# Patient Record
Sex: Female | Born: 1960 | Race: White | Hispanic: Yes | Smoking: Never smoker
Health system: Southern US, Community
[De-identification: ages and names within clinical notes are randomized; demographics above are authoritative.]

## PROBLEM LIST (undated history)

## (undated) DIAGNOSIS — T7840XA Allergy, unspecified, initial encounter: Secondary | ICD-10-CM

## (undated) DIAGNOSIS — D649 Anemia, unspecified: Secondary | ICD-10-CM

## (undated) DIAGNOSIS — M1990 Unspecified osteoarthritis, unspecified site: Secondary | ICD-10-CM

## (undated) HISTORY — DX: Anemia, unspecified: D64.9

## (undated) HISTORY — DX: Allergy, unspecified, initial encounter: T78.40XA

## (undated) HISTORY — DX: Unspecified osteoarthritis, unspecified site: M19.90

---

## 2002-08-26 HISTORY — PX: TUBAL LIGATION: SHX77

## 2002-10-25 ENCOUNTER — Encounter: Admission: RE | Admit: 2002-10-25 | Discharge: 2002-10-25 | Payer: Self-pay | Admitting: Sports Medicine

## 2004-04-09 ENCOUNTER — Encounter: Admission: RE | Admit: 2004-04-09 | Discharge: 2004-04-09 | Payer: Self-pay | Admitting: Sports Medicine

## 2004-04-09 ENCOUNTER — Encounter: Payer: Self-pay | Admitting: Family Medicine

## 2004-06-18 ENCOUNTER — Ambulatory Visit: Payer: Self-pay | Admitting: Family Medicine

## 2004-06-29 ENCOUNTER — Ambulatory Visit (HOSPITAL_COMMUNITY): Admission: RE | Admit: 2004-06-29 | Discharge: 2004-06-29 | Payer: Self-pay | Admitting: Family Medicine

## 2005-07-26 ENCOUNTER — Encounter (INDEPENDENT_AMBULATORY_CARE_PROVIDER_SITE_OTHER): Payer: Self-pay | Admitting: *Deleted

## 2005-07-26 LAB — CONVERTED CEMR LAB

## 2005-08-05 ENCOUNTER — Encounter: Payer: Self-pay | Admitting: Family Medicine

## 2005-08-05 ENCOUNTER — Ambulatory Visit: Payer: Self-pay | Admitting: Family Medicine

## 2005-08-30 ENCOUNTER — Ambulatory Visit (HOSPITAL_COMMUNITY): Admission: RE | Admit: 2005-08-30 | Discharge: 2005-08-30 | Payer: Self-pay | Admitting: Family Medicine

## 2006-10-24 ENCOUNTER — Encounter (INDEPENDENT_AMBULATORY_CARE_PROVIDER_SITE_OTHER): Payer: Self-pay | Admitting: *Deleted

## 2006-12-14 ENCOUNTER — Encounter: Payer: Self-pay | Admitting: Family Medicine

## 2006-12-15 ENCOUNTER — Encounter (INDEPENDENT_AMBULATORY_CARE_PROVIDER_SITE_OTHER): Payer: Self-pay | Admitting: Family Medicine

## 2006-12-15 ENCOUNTER — Ambulatory Visit: Payer: Self-pay | Admitting: Family Medicine

## 2006-12-15 ENCOUNTER — Encounter: Payer: Self-pay | Admitting: Family Medicine

## 2006-12-15 LAB — CONVERTED CEMR LAB
Chlamydia, DNA Probe: NEGATIVE
Free T4: 1.02 ng/dL (ref 0.89–1.80)
GC Probe Amp, Genital: NEGATIVE
HCT: 36.5 % (ref 36.0–46.0)
Hemoglobin: 12 g/dL (ref 12.0–15.0)
KOH Prep: NEGATIVE
MCHC: 32.9 g/dL (ref 30.0–36.0)
MCV: 83 fL (ref 78.0–100.0)
Platelets: 304 10*3/uL (ref 150–400)
RBC: 4.4 M/uL (ref 3.87–5.11)
RDW: 14.4 % — ABNORMAL HIGH (ref 11.5–14.0)
TSH: 0.893 microintl units/mL (ref 0.350–5.50)
WBC: 4.9 10*3/uL (ref 4.0–10.5)

## 2006-12-17 ENCOUNTER — Encounter (INDEPENDENT_AMBULATORY_CARE_PROVIDER_SITE_OTHER): Payer: Self-pay | Admitting: Family Medicine

## 2006-12-17 LAB — CONVERTED CEMR LAB: Pap Smear: NORMAL

## 2006-12-22 ENCOUNTER — Encounter (INDEPENDENT_AMBULATORY_CARE_PROVIDER_SITE_OTHER): Payer: Self-pay | Admitting: Family Medicine

## 2006-12-23 ENCOUNTER — Encounter (INDEPENDENT_AMBULATORY_CARE_PROVIDER_SITE_OTHER): Payer: Self-pay | Admitting: Family Medicine

## 2006-12-29 ENCOUNTER — Encounter: Payer: Self-pay | Admitting: Family Medicine

## 2006-12-29 ENCOUNTER — Ambulatory Visit: Payer: Self-pay | Admitting: Family Medicine

## 2007-07-27 ENCOUNTER — Ambulatory Visit: Payer: Self-pay | Admitting: Family Medicine

## 2007-07-27 LAB — CONVERTED CEMR LAB
H Pylori IgG: POSITIVE
HCT: 39.8 % (ref 36.0–46.0)
Hemoglobin: 13 g/dL (ref 12.0–15.0)
MCHC: 32.7 g/dL (ref 30.0–36.0)
MCV: 89.8 fL (ref 78.0–100.0)
Platelets: 339 10*3/uL (ref 150–400)
RBC: 4.43 M/uL (ref 3.87–5.11)
RDW: 14.6 % (ref 11.5–15.5)
WBC: 5.4 10*3/uL (ref 4.0–10.5)

## 2007-07-28 ENCOUNTER — Encounter: Payer: Self-pay | Admitting: Family Medicine

## 2007-12-23 ENCOUNTER — Ambulatory Visit (HOSPITAL_COMMUNITY): Admission: RE | Admit: 2007-12-23 | Discharge: 2007-12-23 | Payer: Self-pay | Admitting: Family Medicine

## 2007-12-28 ENCOUNTER — Encounter: Payer: Self-pay | Admitting: Family Medicine

## 2008-01-29 ENCOUNTER — Encounter: Payer: Self-pay | Admitting: Family Medicine

## 2008-02-08 ENCOUNTER — Ambulatory Visit: Payer: Self-pay | Admitting: Family Medicine

## 2008-03-02 ENCOUNTER — Encounter: Payer: Self-pay | Admitting: Family Medicine

## 2008-03-02 ENCOUNTER — Ambulatory Visit: Payer: Self-pay | Admitting: Family Medicine

## 2008-04-29 IMAGING — MG MM DIGITAL SCREENING BILAT
4 series · 4 of 4 positions shown · non-contrast
Comparison: none

DG SCREEN MAMMOGRAM BILATERAL
Bilateral CC and MLO view(s) were taken.

DIGITAL SCREENING MAMMOGRAM WITH CAD:
There are scattered fibroglandular densities.  No masses or malignant type calcifications are 
identified.  Compared with prior studies.

[R CC]
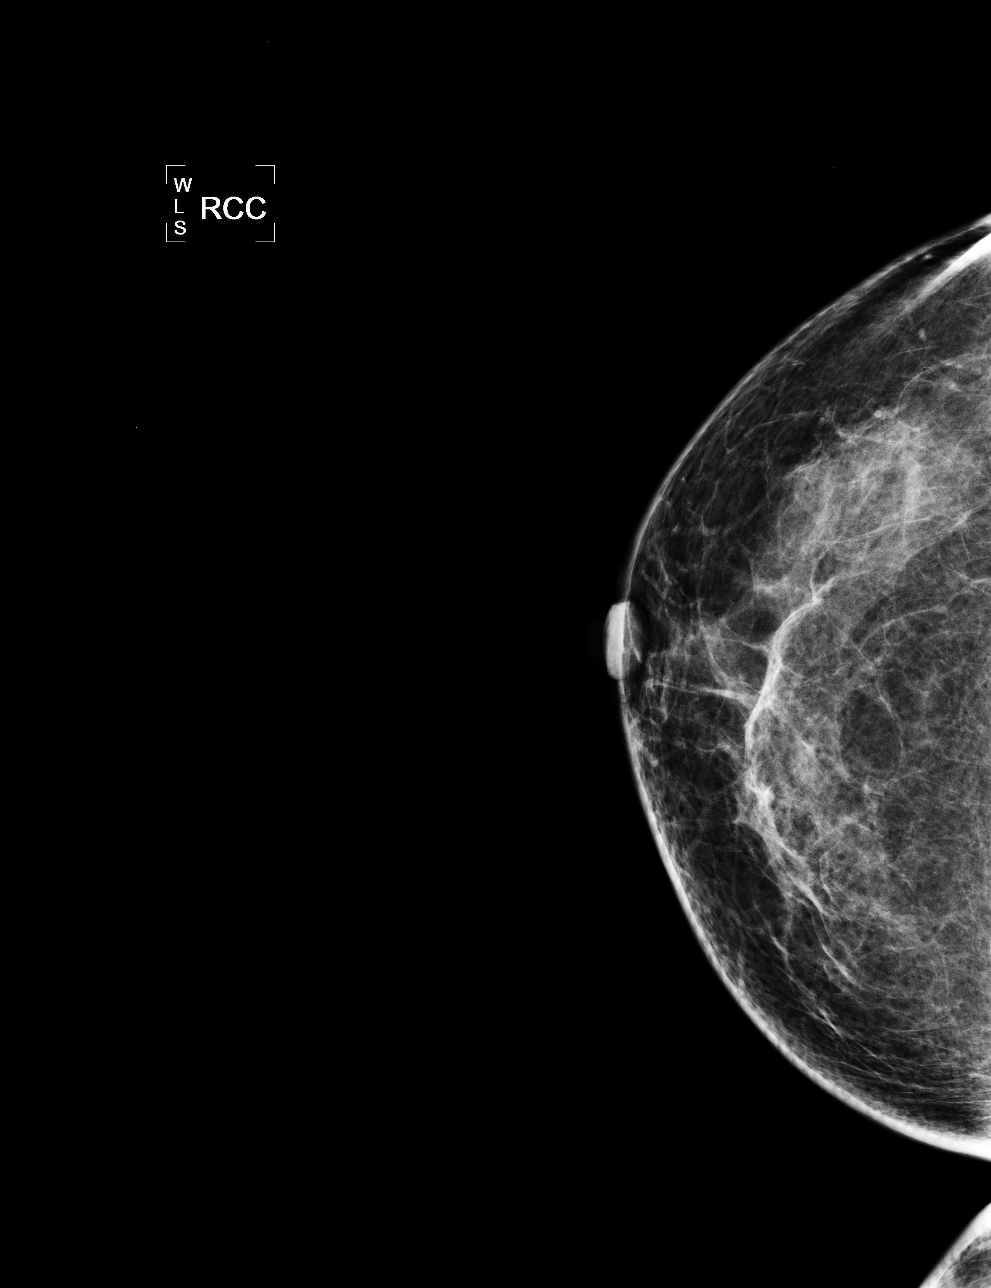

[R MLO]
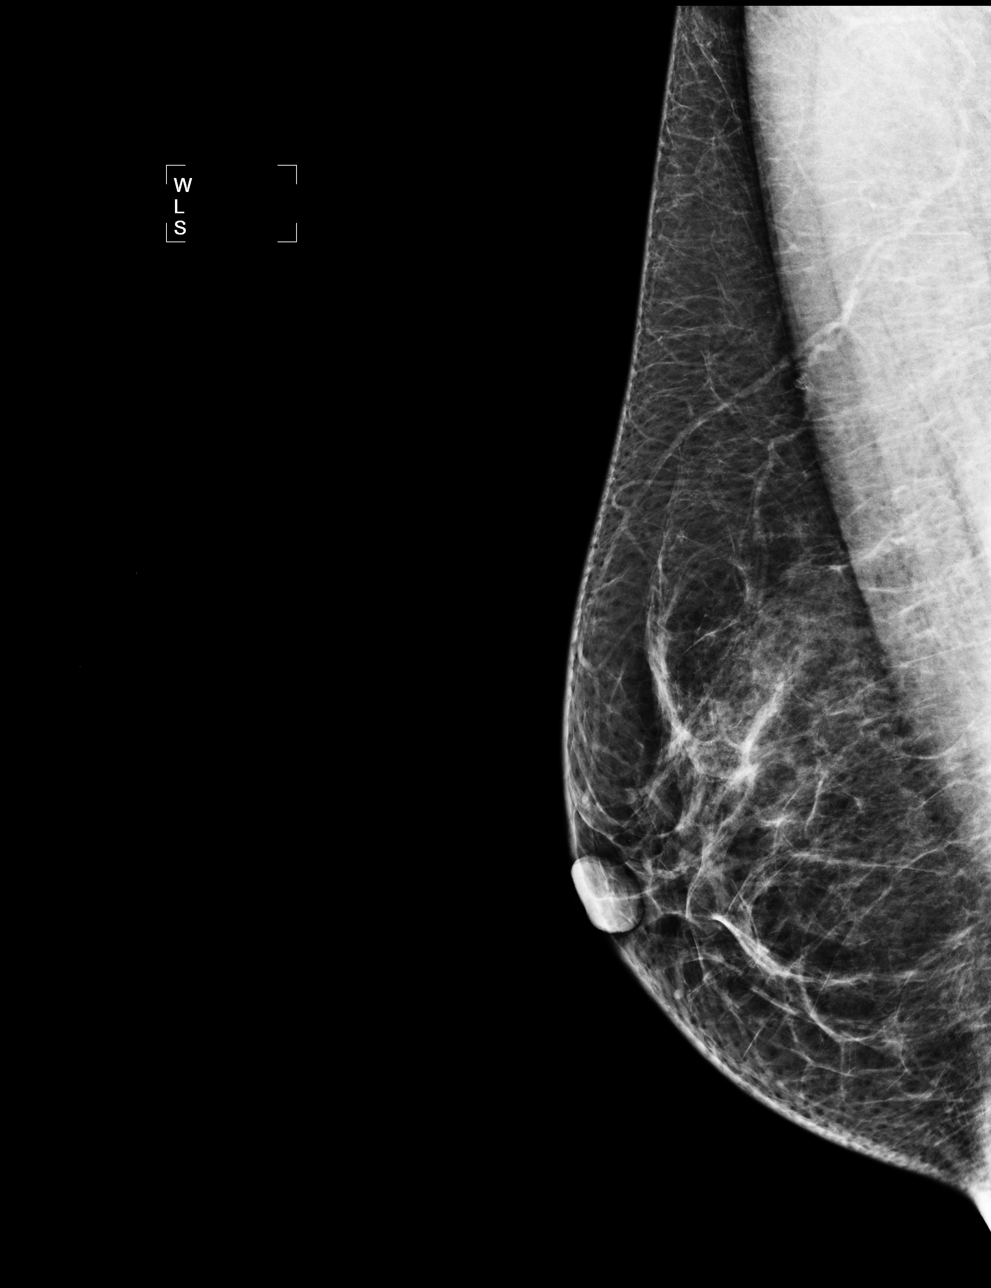

[L CC]
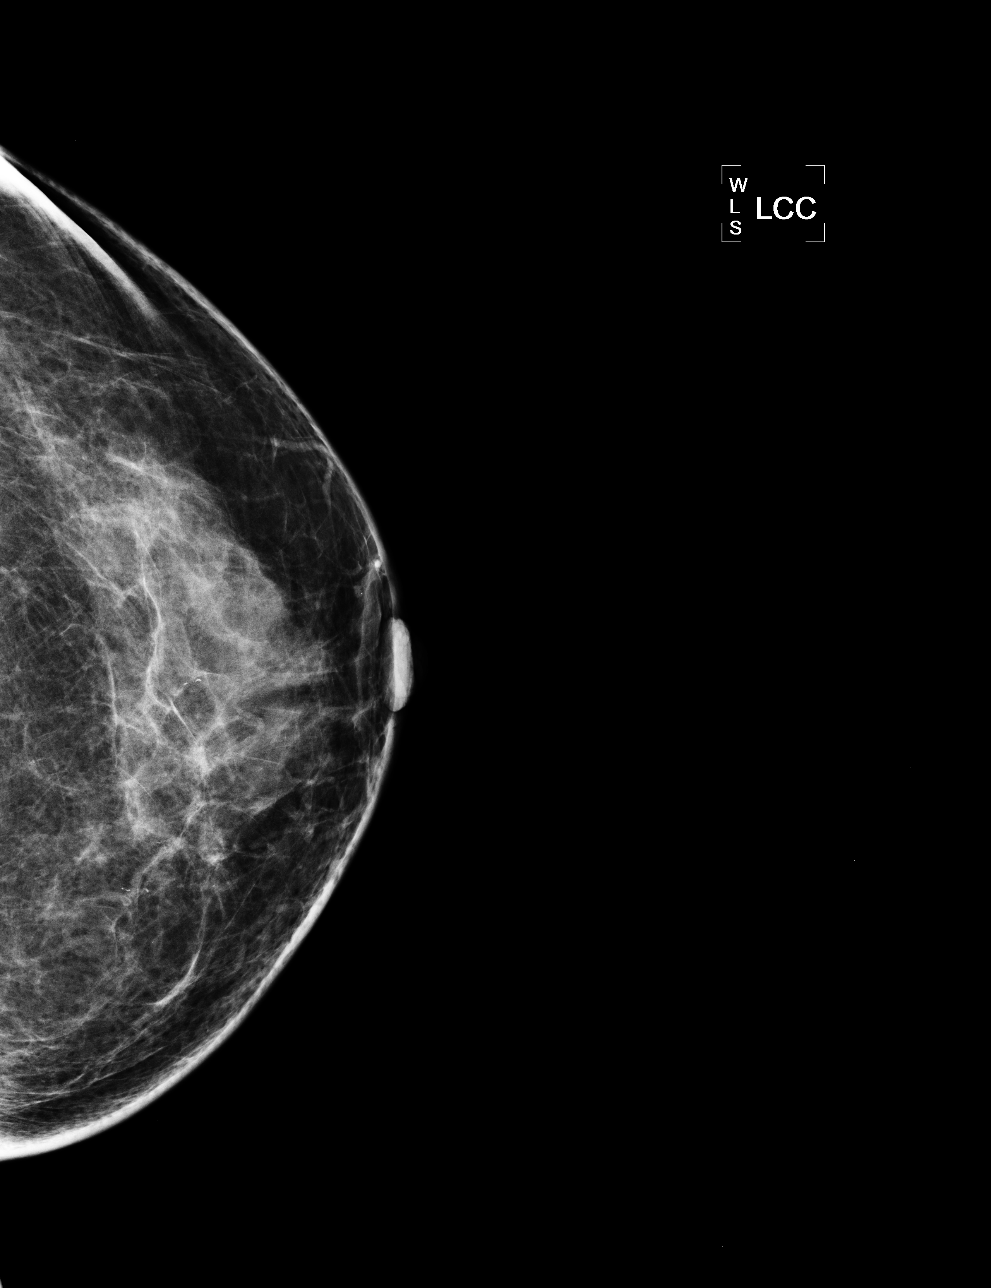

[L MLO]
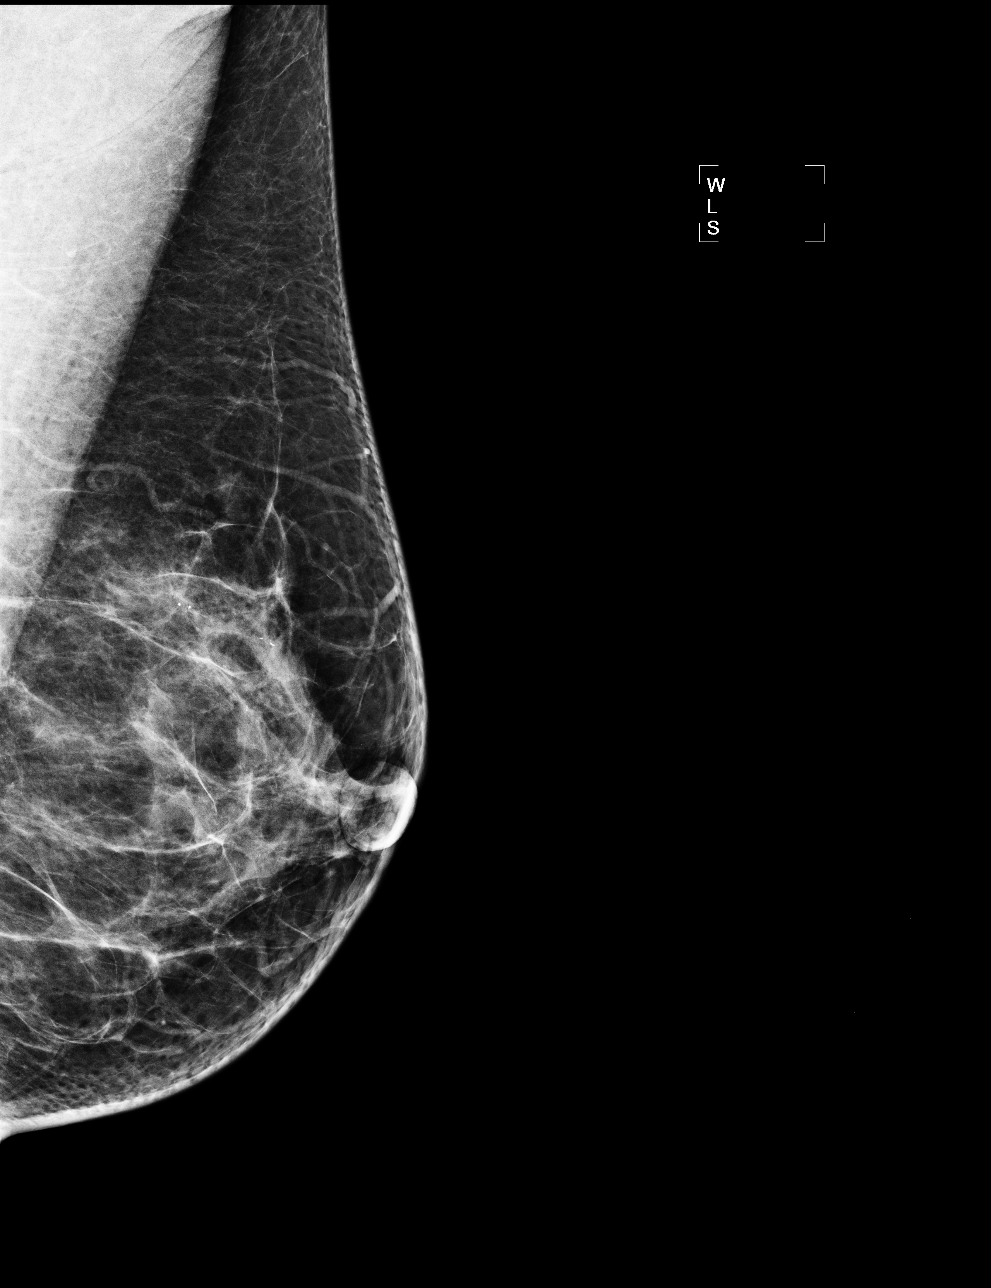

[4 of 4 positions shown; findings below may reference images not displayed]

IMPRESSION: No specific mammographic evidence of malignancy.  Next screening mammogram is recommended in one 
year.

ASSESSMENT: Negative - BI-RADS 1

Screening mammogram in 1 year.
ANALYZED BY COMPUTER AIDED DETECTION. , THIS PROCEDURE WAS A DIGITAL MAMMOGRAM.

## 2008-07-11 ENCOUNTER — Ambulatory Visit: Payer: Self-pay | Admitting: Family Medicine

## 2008-07-14 ENCOUNTER — Encounter: Payer: Self-pay | Admitting: Family Medicine

## 2008-07-20 ENCOUNTER — Ambulatory Visit (HOSPITAL_COMMUNITY): Admission: RE | Admit: 2008-07-20 | Discharge: 2008-07-20 | Payer: Self-pay | Admitting: Family Medicine

## 2008-08-08 ENCOUNTER — Encounter (INDEPENDENT_AMBULATORY_CARE_PROVIDER_SITE_OTHER): Payer: Self-pay | Admitting: Family Medicine

## 2008-08-08 ENCOUNTER — Ambulatory Visit: Payer: Self-pay | Admitting: Family Medicine

## 2008-08-08 ENCOUNTER — Encounter (INDEPENDENT_AMBULATORY_CARE_PROVIDER_SITE_OTHER): Payer: Self-pay | Admitting: *Deleted

## 2008-08-08 ENCOUNTER — Other Ambulatory Visit: Admission: RE | Admit: 2008-08-08 | Discharge: 2008-08-08 | Payer: Self-pay | Admitting: Family Medicine

## 2008-08-08 DIAGNOSIS — H11009 Unspecified pterygium of unspecified eye: Secondary | ICD-10-CM | POA: Insufficient documentation

## 2008-08-08 LAB — CONVERTED CEMR LAB
Chlamydia, DNA Probe: NEGATIVE
GC Probe Amp, Genital: NEGATIVE

## 2008-08-09 ENCOUNTER — Encounter (INDEPENDENT_AMBULATORY_CARE_PROVIDER_SITE_OTHER): Payer: Self-pay | Admitting: Family Medicine

## 2008-12-30 ENCOUNTER — Ambulatory Visit (HOSPITAL_COMMUNITY): Admission: RE | Admit: 2008-12-30 | Discharge: 2008-12-30 | Payer: Self-pay | Admitting: Obstetrics & Gynecology

## 2009-04-24 ENCOUNTER — Ambulatory Visit: Payer: Self-pay | Admitting: Family Medicine

## 2009-04-24 DIAGNOSIS — G4700 Insomnia, unspecified: Secondary | ICD-10-CM

## 2009-05-08 ENCOUNTER — Ambulatory Visit: Payer: Self-pay | Admitting: Family Medicine

## 2009-07-27 ENCOUNTER — Ambulatory Visit: Payer: Self-pay | Admitting: Family Medicine

## 2009-07-27 DIAGNOSIS — G5600 Carpal tunnel syndrome, unspecified upper limb: Secondary | ICD-10-CM | POA: Insufficient documentation

## 2009-07-27 DIAGNOSIS — E663 Overweight: Secondary | ICD-10-CM | POA: Insufficient documentation

## 2009-08-28 ENCOUNTER — Ambulatory Visit: Payer: Self-pay | Admitting: Family Medicine

## 2009-08-28 DIAGNOSIS — N959 Unspecified menopausal and perimenopausal disorder: Secondary | ICD-10-CM | POA: Insufficient documentation

## 2009-09-03 ENCOUNTER — Encounter: Payer: Self-pay | Admitting: Family Medicine

## 2009-09-03 LAB — CONVERTED CEMR LAB
ALT: 25 units/L (ref 0–35)
AST: 26 units/L (ref 0–37)
Albumin: 4.1 g/dL (ref 3.5–5.2)
Alkaline Phosphatase: 103 units/L (ref 39–117)
BUN: 16 mg/dL (ref 6–23)
CO2: 25 meq/L (ref 19–32)
Calcium: 9.2 mg/dL (ref 8.4–10.5)
Chloride: 102 meq/L (ref 96–112)
Creatinine, Ser: 0.69 mg/dL (ref 0.40–1.20)
Direct LDL: 122 mg/dL — ABNORMAL HIGH
Glucose, Bld: 97 mg/dL (ref 70–99)
Potassium: 4.1 meq/L (ref 3.5–5.3)
Sodium: 140 meq/L (ref 135–145)
TSH: 2.356 microintl units/mL (ref 0.350–4.500)
Total Bilirubin: 0.4 mg/dL (ref 0.3–1.2)
Total Protein: 6.9 g/dL (ref 6.0–8.3)

## 2010-01-01 ENCOUNTER — Encounter: Payer: Self-pay | Admitting: Family Medicine

## 2010-01-01 ENCOUNTER — Ambulatory Visit (HOSPITAL_COMMUNITY): Admission: RE | Admit: 2010-01-01 | Discharge: 2010-01-01 | Payer: Self-pay | Admitting: Obstetrics & Gynecology

## 2010-01-15 ENCOUNTER — Ambulatory Visit: Payer: Self-pay | Admitting: Family Medicine

## 2010-01-15 DIAGNOSIS — R519 Headache, unspecified: Secondary | ICD-10-CM | POA: Insufficient documentation

## 2010-01-15 DIAGNOSIS — R51 Headache: Secondary | ICD-10-CM

## 2010-01-29 ENCOUNTER — Ambulatory Visit: Payer: Self-pay | Admitting: Family Medicine

## 2010-06-04 ENCOUNTER — Ambulatory Visit: Payer: Self-pay | Admitting: Family Medicine

## 2010-06-04 DIAGNOSIS — M2550 Pain in unspecified joint: Secondary | ICD-10-CM | POA: Insufficient documentation

## 2010-06-06 ENCOUNTER — Ambulatory Visit: Payer: Self-pay | Admitting: Family Medicine

## 2010-06-06 ENCOUNTER — Encounter: Payer: Self-pay | Admitting: Family Medicine

## 2010-06-06 LAB — CONVERTED CEMR LAB
HCT: 39.1 % (ref 36.0–46.0)
Hemoglobin: 13.3 g/dL (ref 12.0–15.0)
MCHC: 34 g/dL (ref 30.0–36.0)
MCV: 86.9 fL (ref 78.0–100.0)
Platelets: 308 10*3/uL (ref 150–400)
RBC: 4.5 M/uL (ref 3.87–5.11)
RDW: 14.1 % (ref 11.5–15.5)
WBC: 4.2 10*3/uL (ref 4.0–10.5)

## 2010-06-07 ENCOUNTER — Encounter: Payer: Self-pay | Admitting: Family Medicine

## 2010-06-15 ENCOUNTER — Telehealth: Payer: Self-pay | Admitting: *Deleted

## 2010-08-28 ENCOUNTER — Ambulatory Visit: Admission: RE | Admit: 2010-08-28 | Discharge: 2010-08-28 | Payer: Self-pay | Source: Home / Self Care

## 2010-08-28 DIAGNOSIS — N949 Unspecified condition associated with female genital organs and menstrual cycle: Secondary | ICD-10-CM | POA: Insufficient documentation

## 2010-08-28 LAB — CONVERTED CEMR LAB: Pap Smear: NEGATIVE

## 2010-08-29 ENCOUNTER — Encounter: Payer: Self-pay | Admitting: Family Medicine

## 2010-09-25 NOTE — Assessment & Plan Note (Signed)
Summary: benign HA and pterygium   Vital Signs:  Patient profile:   50 year old female Menstrual status:  irregular Height:      60.5 inches Weight:      137.0 pounds BMI:     26.41 Temp:     98.3 degrees F oral Pulse rate:   80 / minute BP sitting:   113 / 71  (left arm) Cuff size:   regular  Vitals Entered By: Gladstone Pih (Jan 15, 2010 2:58 PM) CC: C/O blurry vision and HA x1week  Vision Screening:Left eye w/o correction: 20 / 25 Right Eye w/o correction: 20 / 25 Both eyes w/o correction:  20/ 25        Vision Entered By: Arlyss Repress CMA, (Jan 15, 2010 3:16 PM)   Primary Care Provider:  Zachery Dauer MD  CC:  C/O blurry vision and HA x1week.  History of Present Illness: 50yo F c/o HA  HA: x 1 week.  Course unchanged.  Localized to forehead b/l.  "Squeezing headache."  Pain scale is 1-2/10.  Improved after taking ibuprofen.  Also c/o right eye irritation.  Eye has been red and irritated x 1 week.  No eye discharge.  Endorses blurry vision of the right eye only.  No fevers.  No N/V.  No hx of allergies.  No hx of chronic HAs or migraines.  No sick contacts.  LMP 12/29/09.    Current Medications (verified): 1)  None  Allergies (verified): No Known Drug Allergies  Review of Systems      See HPI  Physical Exam  General:  VS Reviewed. Well appearing, NAD. Translator present   Head:  normocephalic and atraumatic.  normocephalic and atraumatic.   Eyes:  Obvious pterygium on medial aspect of the R eye covering part of the iris not crossing over to the pupil; mild injected conjunctiva of the right eye; no drainage;  EOMI PERRLA Neck:  full ROM Lungs:  Normal respiratory effort, chest expands symmetrically. Lungs are clear to auscultation, no crackles or wheezes. Heart:  Normal rate and regular rhythm. S1 and S2 normal without gallop, murmur, click, rub or other extra sounds. Neurologic:  alert & oriented X3, cranial nerves II-XII intact, strength normal in all  extremities, sensation intact to light touch, sensation intact to pinprick, gait normal, and DTRs symmetrical and normal.  alert & oriented X3, cranial nerves II-XII intact, strength normal in all extremities, sensation intact to light touch, sensation intact to pinprick, gait normal, and DTRs symmetrical and normal.     Impression & Recommendations:  Problem # 1:  HEADACHE (ICD-784.0) Assessment New  Benign headache w/o any neurological deficits. Supportive care with ibuprofen. The blurry vision is unusual and not likely associated with the headache or the pterygium.   Will follow closely and reassess in 2 weeks.  Her snellen was 20/25.  Fundoscopic exam was nl.  The following medications were removed from the medication list:    Naproxen 500 Mg Tabs (Naproxen) .Marland Kitchen... 1 by mouth two times a day as needed for wrist pain spanish instructions  Orders: [facility]- Est Level  3 (40981)  Problem # 2:  PTERYGIUM (ICD-372.40) Assessment: Unchanged  Likely the cause of her injected conjunctiva in addition to new allergies. Will treat with OTC zyrtec for symptoms. The ptyergium is not covering the pupil.   No signs of infection. Will reassess in 2 weeks.  Orders: [facility]- Est Level  3 ([id number])  Complete Medication List: 1)  Zyrtec Allergy 10 Mg  Tabs (Cetirizine hcl) .Marland Kitchen.. 1 tab by mouth daily as needed for irritated eyes  [phone][facility]  Patient Instructions: 1)  Please schedule a follow-up appointment in 2 weeks with Dr. Sheffield Slider [facility] reassess. 2)  Start taking Zyrtec 10mg  daily as needed for irritated eyes. 3)  It is ok to continue with ibuprofen 400mg  as needed for headaches.  4)    5)  porfavor haga una cita en 2 semanas y si empeora venga antes. 6)  Compre en la farmacia  Zyrtec 10mg  tome una pastilla pra lo irritado del ojo. Continue tomando Ibuprofen 400mg . segun lo ocupe para dolor de [place]

## 2010-09-25 NOTE — Progress Notes (Signed)
Summary: return phone call  Phone Note Call from Patient   Caller: Daughter Summary of Call: Verlee Monte is returning dr'phone call. Mother can not answer the phone while she is working. Verlee Monte stated they do not received any letter for [facility], I checked the addres with Verlee Monte and we have the correct addres. Initial call taken by: Marines Jean Rosenthal,  June 15, 2010 10:36 AM  Follow-up for Phone Call        mailed letter again. Follow-up by: Arlyss Repress CMA,,  June 15, 2010 11:41 AM     Appended Document: return phone call I spoke with Verlee Monte and went over her mother's labs with her

## 2010-09-25 NOTE — Assessment & Plan Note (Signed)
Summary: PAP/[facility]LVIC/[facility]   Vital Signs:  Patient profile:   50 year old female Menstrual status:  irregular Height:      60.5 inches Weight:      139 pounds BMI:     26.80 Pulse rate:   68 / minute BP sitting:   109 / 68  (right arm)  Vitals Entered By: Arlyss Repress CMA, (August 28, 2009 2:10 PM) CC: physical with pap Is Patient Diabetic? No Pain Assessment Patient in pain? no          Menstrual Status irregular Last PAP Result NEGATIVE FOR INTRAEPITHELIAL LESIONS OR MALIGNANCY.   Primary Care Provider:  Zachery Dauer MD  CC:  physical with pap.  History of Present Illness: Sarah Peck has been feeling better, with little discomfort in her hands. She's been wearing the wrist splints and taking Naprosyn daily.   She has been sleeping well taking the Trazadone every night.   She does have spells of feeling hot all over over the past few months. She missed her November menses, but had a normal period on Dec 25. Usually last 3-5 days. Has lived with the same man for the past year. Not taking calcium or Vitamins.  Ate last at 11 AM.   Habits & Providers  Alcohol-Tobacco-Diet     Tobacco Status: never  Current Medications (verified): 1)  Trazodone Hcl 50 Mg Tabs (Trazodone Hcl) .Marland Kitchen.. 1 Tab By Mouth About 45 Minutes Before Bed Time 2)  Naproxen 500 Mg Tabs (Naproxen) .Marland Kitchen.. 1 By Mouth Two Times A Day As Needed For Wrist Pain Spanish Instructions 3)  Calcium-D 600-200 Mg-Unit Caps (Calcium Carbonate-Vitamin D) .... Take One Tablet Two Times Daily 4)  Multivitamins   Tabs (Multiple Vitamin) .... Take One Tablet Daily.  Allergies (verified): No Known Drug Allergies  Physical Exam  General:  well appearing, no apparent distress.  Neck:  No deformities, masses, or tenderness noted.no thyromegaly.   Breasts:  No mass, nodules, thickening, tenderness, bulging, retraction, inflamation, nipple discharge or skin changes noted.   Lungs:  Normal respiratory effort, chest expands  symmetrically. Lungs are clear to auscultation, no crackles or wheezes. Heart:  Normal rate and regular rhythm. S1 and S2 normal without gallop, murmur, click, rub or other extra sounds. Abdomen:  Bowel sounds positive,abdomen soft and non-tender without masses, organomegaly or hernias noted. Genitalia:  Normal introitus for age, no external lesions, no vaginal discharge, mucosa pink and moist, no vaginal or cervical lesions, no vaginal atrophy, no friaility or hemorrhage, normal uterus size and position, no adnexal masses or tenderness   Impression & Recommendations:  Problem # 1:  GYNECOLOGICAL EXAMINATION, ROUTINE (ICD-V72.31) Assessment Unchanged  Problem # 2:  CARPAL TUNNEL SYNDROME (ICD-354.0) Assessment: Improved will get labs she missed before Orders: [facility]- Est Level  3 (65784) Comp Met-[facility] ([id number]) TSH-[facility] (32440-10272)  Problem # 3:  INSOMNIA (ICD-780.52) Doing well on Trazadone. Orders: [facility]- Est Level  3 (99213) Comp Met-[facility] ([id number])  Complete Medication List: 1)  Trazodone Hcl 50 Mg Tabs (Trazodone hcl) .Marland Kitchen.. 1 tab by mouth about 45 minutes before bed time 2)  Naproxen 500 Mg Tabs (Naproxen) .Marland Kitchen.. 1 by mouth two times a day as needed for wrist pain spanish instructions 3)  Calcium-d 600-200 Mg-unit Caps (Calcium carbonate-vitamin d) .... Take one tablet two times daily 4)  Multivitamins Tabs (Multiple vitamin) .... Take one tablet daily.  Other Orders: Pap Smear-[facility] ([id number]) Direct LDL-[facility] ([id number])  Patient Instructions: 1)  Please schedule a follow-up appointment as needed .  2)  Regrese en 12 meses 3)  Tomar calcium 1200mg  +vitamin D 800 units daily. Para prevenir falta de calcio en los huesos.  4)  Voy a llamarle si hay algo anormal en la [employer].  5)  Use las munequeras  y Naproxen cuando tenga dolor de las manos.  6)  Schedule your mammogram.      Prevention & Chronic Care Immunizations   Influenza vaccine: Fluvax 3+   (07/27/2007)   Influenza vaccine due: 07/26/2008    Tetanus booster: 03/26/2004: Done.   Tetanus booster due: 03/26/2014    Pneumococcal vaccine: Not documented  Other Screening   Pap smear: NEGATIVE FOR INTRAEPITHELIAL LESIONS OR MALIGNANCY.  (08/08/2008)   Pap smear due: 12/17/2007    Mammogram: Normal  (12/28/2007)   Mammogram due: 12/27/2008   Smoking status: never  (08/28/2009)  Lipids   Total Cholesterol: Not documented   LDL: Not documented   LDL Direct: Not documented   HDL: Not documented   Triglycerides: Not documented

## 2010-09-25 NOTE — Assessment & Plan Note (Signed)
Summary: swalling hands/mj   Vital Signs:  Patient profile:   50 year old female Menstrual status:  irregular Weight:      140.3 pounds Temp:     98.4 degrees F oral Pulse rate:   68 / minute Pulse rhythm:   regular BP sitting:   127 / 75  (left arm) Cuff size:   regular  Vitals Entered By: Loralee Pacas CMA (June 04, 2010 4:28 PM) Is Patient Diabetic? No Comments patient states that she has tingiling that starts in both shoulders and goes down into her hands. the feeling lasts all day, this has been going on for apprx 3 months   Primary Care Provider:  Zachery Dauer MD   History of Present Illness: 1.  Pain in back, shoulders, arms, wrists, and hands: - Pt presents with pain all over her upper body from her back down into her hands.  She has had the wrist and hand pain for about 3 months.  She was given some medicine for that as well as some wrist braces which she has been using.  The pain initially responded to medicine but then it was no longer helping.  Now she is having pain all over her upper extremity.  She doesn't remember any injury or inciting event.  Pain is rated a 3/10 but seems to be getting a little bit worse.  The pain is constant.  The pain is worst in her back.  She does endorse some numbness in her wrists and hands bilaterally.  She also endorese some hand swelling in the morning  ROS: denies fevers, chills, other joint pain, cyanosis, and skin color change  Habits & Providers  Alcohol-Tobacco-Diet     Alcohol drinks/day: 0     Tobacco Status: never  Current Medications (verified): 1)  Zyrtec Allergy 10 Mg Tabs (Cetirizine Hcl) .Marland Kitchen.. 1 Tab By Mouth Daily As Needed For Irritated Eyes  Allergies: No Known Drug Allergies  Physical Exam  General:  vitals reviewed.  well appearing, comfortable appearing Neck:  full ROM. negative Spurlings. Lungs:  normal respiratory effort.   Heart:  normal rate and regular rhythm.   Msk:  Right neck: trapezius tight  improved with massage  Left neck: trapezius tight improved with massage  Right shoulder:  full ROM.  no deformity.  5/5 strength with rotator cuff testing.  Left shoulder:  full ROM.  no deformity.  5/5 strength with rotator cuff testing  Right elbow:  normal flexion / extension.  Pain reproduced with tapping over the lateral cubital fossa  Left elbow: normal flextion / extension.  Pain reproduced with tapping over the lateral cubital fossa  Right wrist:  normal ROM.  No swelling.  Decreased sensation to light touch circumferentially around wrist and into hand.  Negative Tinnels.  Left wrist:  normal ROM.  No swelling.  Decreased sensation to light touch circumferentially around wrist and into hand.  Negative Tinnels.  Right hand:  decreased sensation to light touch diffusely.  No skin changes.  Normal grip strength  Left hand:  decreased sensation to light touch diffusely.  no skin changes.  norma grip strength   Impression & Recommendations:  Problem # 1:  ARTHRALGIA (ICD-719.40) Assessment New Unsure of cause of diffuse pain.  Will check CBC to look for early signs of anemia of chronic disease.  Will check Sed Rate to look for underlying inflammatory signs.  Pt doesn't have definite signs of radicular disease so will hold off on imaging for now.  If not improved would consider x-rays of cervical spine.  She does have some signs of cubital tunnel syndrom and carpal tunnel syndrome, however, she also doesn't have insurance so it would be difficult to get nerve conduction studies. Orders: [facility]- Est Level  3 (99213)Future Orders: Sed Rate (ESR)-[facility] [phone]) ... 07/30/2011 CBC-[facility] (52841) ... 07/30/2011  Complete Medication List: 1)  Zyrtec Allergy 10 Mg Tabs (Cetirizine hcl) .Marland Kitchen.. 1 tab by mouth daily as needed for irritated eyes  Patient Instructions: 1)  We will get some blood work today to see if we can figure out why you are having all of this pain 2)  We will let you know about  the results 3)  In the mean time you can continue to take Tylenol.  I would take 500 mg every 8 hours scheduled.  If you are still having pain with this then you can take some Ibuprofen 400 mg every 6 hours as needed for pain. 4)  Please schedule a follow up appointment 6 weeks

## 2010-09-25 NOTE — Assessment & Plan Note (Signed)
Summary: Headache and blurry vision- improved   Vital Signs:  Patient profile:   50 year old female Menstrual status:  irregular Height:      60.5 inches Weight:      138 pounds BMI:     26.60 Temp:     98.4 degrees F oral Pulse rate:   69 / minute BP sitting:   106 / 65  (left arm) Cuff size:   regular  Vitals Entered By: Tessie Fass CMA (January 29, 2010 2:40 PM) CC: F/U Is Patient Diabetic? No Pain Assessment Patient in pain? no        Primary Care Provider:  Zachery Dauer MD  CC:  F/U.  History of Present Illness: 50yo F here for f/u of recent HA and blurry vision  HA: Resolved.  No further episodes of HA since last visit.    Blurry vision: Improved but continues to have occasional symptoms after she rubs her eye when her eye itches.  Does report improvement of the eye itching after using the zyrtec.  No associated dizziness, N/V, syncopal events, or eye pain.   Preventative: States that she received her mammogram last month and received a letter notifying her that it was nl.    Habits & Providers  Alcohol-Tobacco-Diet     Tobacco Status: never  Allergies: No Known Drug Allergies  Review of Systems      See HPI  Physical Exam  General:  VS Reviewed. Well appearing, NAD. Translator present   Head:  atraumatic.   Eyes:  Obvious pterygium on medial aspect of the R eye covering part of the iris not crossing over to the pupil; less injected conjunctiva of the right eye compared to previous exam; no drainage;  EOMI PERRLA   Impression & Recommendations:  Problem # 1:  HEADACHE (ICD-784.0) Assessment Improved  Resolved.  Orders: [facility]- Est Level  3 ([address])  Problem # 2:  BLURRED VISION (ICD-368.8) Assessment: Improved  Seems to be related more to irritation from rubbing the eyes.  No neurological or sensory dysfunction. Continue with the Zyrtec as this is helping and avoidance of rubbing the eyes.  Orders: [facility]- Est Level  3 (60454)  Problem # 3:   Preventive Health Care (ICD-V70.0) Assessment: Comment Only Up to date on pap smear and mammogram. Will obtain outside records/results.  Complete Medication List: 1)  Zyrtec Allergy 10 Mg Tabs (Cetirizine hcl) .Marland Kitchen.. 1 tab by mouth daily as needed for irritated eyes   Prevention & Chronic Care Immunizations   Influenza vaccine: Fluvax 3+  (07/27/2007)   Influenza vaccine due: 07/26/2008    Tetanus booster: 03/26/2004: Done.   Tetanus booster due: 03/26/2014    Pneumococcal vaccine: Not documented  Other Screening   Pap smear: NEGATIVE FOR INTRAEPITHELIAL LESIONS OR MALIGNANCY.  (08/28/2009)   Pap smear due: 09/03/2012    Mammogram: Normal  (12/28/2007)   Mammogram action/deferral: Not indicated  (01/29/2010)   Mammogram due: 12/27/2008   Smoking status: never  (01/29/2010)    Screening comments: Mammogram obtained 12/2009 per pt  Lipids   Total Cholesterol: Not documented   LDL: Not documented   LDL Direct: 122  (08/28/2009)   HDL: Not documented   Triglycerides: Not documented

## 2010-09-25 NOTE — Letter (Signed)
Summary: Results Follow-up Letter  [facility]  [address]   [place], [place] [address]   Phone: [phone]  Fax: [phone]    06/07/2010  [address] [place], [place]  [address]  Dear 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 Porque no hay mucha inflamacion, debe continuar con Acetaminophen la [employer]. Favor de [employer] medicamentos anti-inflamatorios como Ibuprofen (Advil por ejemplo) y Alleve por pocos dias cuando hay mas dolor y hinchason. No padece de anemia.  Tests: (1) CBC NO Diff (Complete Blood Count) (10000)   WBC                       4.2 K/uL                    4.0-10.5   RBC                       4.50 MIL/uL                 3.87-5.11   Hemoglobin                13.3 g/dL                   66.4-40.3   Hematocrit                39.1 %                      36.0-46.0   MCV                       86.9 fL                     78.0-100.0 ! MCH                       29.6 pg                     26.0-34.0   MCHC                      34.0 g/dL                   47.4-25.9   RDW                       14.1 %                      11.5-15.5   Platelet Count            308 K/uL                    150-400    Sincerely,  Zachery Dauer MD [facility]            Appended Document: Results Follow-up Letter mailed.

## 2010-09-25 NOTE — Letter (Signed)
Summary: Results Follow-up Letter  [facility]  [address]   [place], [place] [address]   Phone: [phone]  Fax: [phone]    09/03/2009  [address] [name], [place]  [address]  Dear Ms. Gittens,   The following are the results of your recent test(s):  Prueba    Resultado     Papanicolau    Normal____x___       Patient: Sarah Peck Note: All result statuses are Final unless otherwise noted.  Tests: (1) Comprehensive Metabolic Panel (40347)   Sodium                    140 mEq/L                   135-145   Potassium                 4.1 mEq/L                   3.5-5.3   Chloride                  102 mEq/L                   96-112   CO2                       25 mEq/L                    19-32   Glucose                   97 mg/dL                    42-59   BUN                       16 mg/dL                    5-63   Creatinine                0.69 mg/dL                  0.40-1.20   Bilirubin, Total          0.4 mg/dL                   8.7-5.6   Alkaline Phosphatase      103 U/L                     39-117   AST/SGOT                  26 U/L                      0-37   ALT/SGPT                  25 U/L                      0-35   Total Protein             6.9 g/dL                    4.3-3.2   Albumin  4.1 g/dL                    9.4-8.5   Calcium                   9.2 mg/dL                   4.6-27.0 El higado y otras organos estan funcionando normalmente Tests: (2) TSH (23280)   TSH                       2.356 uIU/mL                0.350-4.500  Funcion de la glandula tiroides es normal.  Tests: (3) LDL, Direct (35009)   LDL, Direct          [H]  381 mg/dL  Cholesterol LDL(colesterol malo):          Su meta es menos de:   130        Sincerely,  Zachery Dauer MD [facility]            Appended Document: Results Follow-up Letter mailed.

## 2010-09-27 NOTE — Letter (Signed)
Summary: Results Follow-up Letter  [facility]  [address]   [place], [place] [address]   Phone: [phone]  Fax: [phone]    08/29/2010  [address] [place], [place]  [address]  Dear [name],   The following are the results of your recent test(s):  Test     Result     Pap Smear    Normal__x_____  Not Normal_____       Comments: _________________________________________________________ Sarah Peck salio' normal. Debemos repetirla en 36 meses.    Sincerely,  Zachery Dauer MD [facility]           Appended Document: Results Follow-up Letter mailed letter to pt

## 2010-09-27 NOTE — Assessment & Plan Note (Signed)
Summary: PE/PAP/mj   Vital Signs:  Patient profile:   50 year old female Menstrual status:  irregular Height:      60.5 inches Weight:      144.3 pounds BMI:     27.82 Pulse rate:   72 / minute BP sitting:   130 / 80  (right arm) Cuff size:   regular  Vitals Entered By: Arlyss Repress CMA, (August 28, 2010 2:21 PM) CC: pap Is Patient Diabetic? No Pain Assessment Patient in pain? no        Primary Care Provider:  Zachery Dauer MD  CC:  pap.  History of Present Illness: Menses are every month lasting 3-4 days, but are preceded by several days of dizziness and headache. They have some clots, but no cramps.   She hasn't noticed any breast lumps   status post tubal ligation for contraception  Habits & Providers  Alcohol-Tobacco-Diet     Tobacco Status: never  Allergies: No Known Drug Allergies  Physical Exam  General:  vitals reviewed.  well appearing, comfortable appearing Eyes:  Clear sclerae, mildly red conjunctivae bilat.no optic disk abnormalities.   Breasts:  No mass, nodules, thickening, tenderness, bulging, retraction, inflamation, nipple discharge or skin changes noted.   Lungs:  Normal respiratory effort, chest expands symmetrically. Lungs are clear to auscultation, no crackles or wheezes. Heart:  Normal rate and regular rhythm. S1 and S2 normal without gallop, murmur, click, rub or other extra sounds. Abdomen:  Bowel sounds positive,abdomen soft and non-tender without masses, organomegaly or hernias noted.Intraumbilical abdominal scar(s).   Genitalia:  Normal introitus for age, no external lesions, no vaginal discharge, mucosa pink and moist, no vaginal or cervical lesions, no vaginal atrophy, no friaility or hemorrhage, normal uterus size and position, no adnexal masses or tenderness Skin:  color normal.   Psych:  good eye contact, not anxious appearing, and not depressed appearing.     Impression & Recommendations:  Problem # 1:  MENORRHAGIA,  PERIMENOPAUSAL (ICD-626.8) Recommended multivitamins with iron. Clinically not anemic appearing. Hemoglobin 13.3 two  months ago.  Orders: [facility]- Est Level  3 (16109)  Problem # 2:  SCREENING FOR MALIGNANT NEOPLASM OF THE CERVIX (ICD-V76.2)  Orders: Pap Smear-[facility] ([id number])  Problem # 3:  PERIMENOPAUSAL SYNDROME (ICD-627.9) Admits to some hot flash type symptoms. We discussed the changes of menopause. I recommended Calcium and Vitamin D supplements and gave her [facility] patient education about ths in [place].  Problem # 4:  HEADACHE (ICD-784.0) May use Ibuprofen or Acetaminophen prn  Complete Medication List: 1)  Zyrtec Allergy 10 Mg Tabs (Cetirizine hcl) .Marland Kitchen.. 1 tab by mouth daily as needed for irritated eyes  Patient Instructions: 1)  La examinacion de los senos y la matriz esta normal. Si la prueba de papanicolau sale normal, solamente tiene que repetir la prueba en 3 anos.  2)  Es importante para mujeres tomar multivitamina con vitamina D 800 unitos cada dia. Tambien la multivitamina debe contener hierro.  3)  Tambien debe tomar calcio 1200 mg cada dia para prevenir osteoporosis que hace debiles los huesos.    Orders Added: 1)  Pap Smear-[facility] [[id number]] 2)  [facility]- Est Level  3 [13086]     Prevention & Chronic Care Immunizations   Influenza vaccine: Fluvax 3+  (07/27/2007)   Influenza vaccine due: 07/26/2008    Tetanus booster: 03/26/2004: Done.   Tetanus booster due: 03/26/2014    Pneumococcal vaccine: Not documented  Other Screening   Pap smear: NEGATIVE FOR INTRAEPITHELIAL LESIONS  OR MALIGNANCY.  (08/28/2009)   Pap smear due: 09/03/2012    Mammogram: Assessment: BIRADS 1. Location: [facility].     (02/01/2010)   Mammogram action/deferral: Screening mammogram in 1 year.     (02/01/2010)   Mammogram due: 02/02/2011   Smoking status: never  (08/28/2010)  Lipids   Total Cholesterol: Not documented   LDL: Not documented   LDL Direct: 122   (08/28/2009)   HDL: Not documented   Triglycerides: Not documented

## 2010-09-30 ENCOUNTER — Encounter: Payer: Self-pay | Admitting: *Deleted

## 2010-11-05 ENCOUNTER — Encounter: Payer: Self-pay | Admitting: Family Medicine

## 2010-11-05 ENCOUNTER — Ambulatory Visit (INDEPENDENT_AMBULATORY_CARE_PROVIDER_SITE_OTHER): Payer: Self-pay | Admitting: Family Medicine

## 2010-11-05 VITALS — BP 125/81 | HR 62 | Temp 97.9°F | Ht 62.0 in | Wt 144.3 lb

## 2010-11-05 DIAGNOSIS — K219 Gastro-esophageal reflux disease without esophagitis: Secondary | ICD-10-CM | POA: Insufficient documentation

## 2010-11-05 DIAGNOSIS — J312 Chronic pharyngitis: Secondary | ICD-10-CM | POA: Insufficient documentation

## 2010-11-05 DIAGNOSIS — N949 Unspecified condition associated with female genital organs and menstrual cycle: Secondary | ICD-10-CM

## 2010-11-05 DIAGNOSIS — G5600 Carpal tunnel syndrome, unspecified upper limb: Secondary | ICD-10-CM

## 2010-11-05 MED ORDER — OMEPRAZOLE MAGNESIUM 20 MG PO TBEC: 20.0000 mg | DELAYED_RELEASE_TABLET | Freq: Every day | ORAL | Status: DC

## 2010-11-05 MED ORDER — GABAPENTIN (ONCE-DAILY) 600 MG PO TABS: 0.5000 | ORAL_TABLET | Freq: Every evening | ORAL | Status: DC | PRN

## 2010-11-05 NOTE — Assessment & Plan Note (Signed)
Recommended wearing the splints and sleeping on her back instead of sides. Trial of gabapentin to allow sleep

## 2010-11-05 NOTE — Assessment & Plan Note (Signed)
No menses since January. Has hot flashes

## 2010-11-05 NOTE — Assessment & Plan Note (Signed)
Reflux may continue the initially viral irritation. Discussed elevating head of bed, avoiding bending over, large meals.

## 2010-11-05 NOTE — Assessment & Plan Note (Signed)
Partially residual from URI, no sinus symptoms,  should improve with time

## 2010-11-05 NOTE — Patient Instructions (Signed)
Tome Omeprazole 20 mg cada noche para agruras y Paramedic.  Tome Gabapentin 1/2 tab cada noche puede repetir [employer].

## 2010-11-05 NOTE — Progress Notes (Signed)
  Subjective:    Patient ID: Sarah Peck, female    DOB: November 23, 1960, 50 y.o.   MRN: [id number]  HPISince a bad cold one month ago she has had an irritation in her throat like "a little bug is stuck in there" Does have a non-productive cough and post-nasal drip. Also has been having acid reflux sx, especially at night. Drinks one cup of coffee daily and no other caffeine NO wheezing. .   She continues to have insomnia that she attributes to discomfort in her wrists and hands. Wears splints, but little help. Usually awakens at 1-2 AM.   Interview conducted in Spanish Review of Systems     Objective:   Physical Exam  Constitutional: She appears well-developed and well-nourished.  HENT:  Right Ear: External ear normal.  Left Ear: External ear normal.  Nose: Nose normal.  Mouth/Throat: Oropharynx is clear and moist. No oropharyngeal exudate.  Eyes:       Small pterygium on R  Cardiovascular: Normal rate and regular rhythm.   Pulmonary/Chest: Effort normal and breath sounds normal.  Lymphadenopathy:    She has no cervical adenopathy.          Assessment & Plan:

## 2011-01-18 ENCOUNTER — Other Ambulatory Visit: Payer: Self-pay | Admitting: Family Medicine

## 2011-01-18 DIAGNOSIS — Z1231 Encounter for screening mammogram for malignant neoplasm of breast: Secondary | ICD-10-CM

## 2011-01-30 ENCOUNTER — Ambulatory Visit (HOSPITAL_COMMUNITY): Payer: Self-pay

## 2011-02-07 ENCOUNTER — Ambulatory Visit (HOSPITAL_COMMUNITY)
Admission: RE | Admit: 2011-02-07 | Discharge: 2011-02-07 | Disposition: A | Discharge status: Home / Self Care | Payer: Self-pay | Source: Ambulatory Visit | Attending: Family Medicine | Admitting: Family Medicine

## 2011-02-07 DIAGNOSIS — Z1231 Encounter for screening mammogram for malignant neoplasm of breast: Secondary | ICD-10-CM | POA: Insufficient documentation

## 2011-05-06 ENCOUNTER — Encounter: Payer: Self-pay | Admitting: Family Medicine

## 2011-05-06 ENCOUNTER — Ambulatory Visit (INDEPENDENT_AMBULATORY_CARE_PROVIDER_SITE_OTHER): Payer: Self-pay | Admitting: Family Medicine

## 2011-05-06 DIAGNOSIS — H1045 Other chronic allergic conjunctivitis: Secondary | ICD-10-CM

## 2011-05-06 DIAGNOSIS — J069 Acute upper respiratory infection, unspecified: Secondary | ICD-10-CM

## 2011-05-06 DIAGNOSIS — H1010 Acute atopic conjunctivitis, unspecified eye: Secondary | ICD-10-CM

## 2011-05-06 NOTE — Progress Notes (Signed)
  Subjective:    Patient ID: Sarah Peck, female    DOB: [birth year], 50 y.o.   MRN: [id number]  HPI Cough/cold symptoms x 1 week: + cough,  + eye itching,  + eye watering,  Eyes feel irritated, White sputum with cough,  H/A off and on x 1 weeks, chills at night.  + sick contact at home with cough.  Pt states that she has had allergies in past that caused her to have eye watering/itching.    Review of Systems + abd pain with menstrual cycles,  No cp, no sob.  As per above.     Objective:   Physical Exam  Constitutional: She is oriented to person, place, and time. She appears well-developed and well-nourished.  HENT:       + watery eye discharge, eye lids with mild swelling, + throat erythema.  Eyes: Conjunctivae and EOM are normal.  Neck: Normal range of motion. No thyromegaly present.  Cardiovascular: Normal rate, regular rhythm and normal heart sounds.   No murmur heard. Pulmonary/Chest: Effort normal. No respiratory distress. She has no wheezes. She has no rales.  Abdominal: Soft. She exhibits no distension. There is no tenderness. There is no rebound.  Musculoskeletal: She exhibits no edema.  Neurological: She is alert and oriented to person, place, and time.  Skin: No rash noted.  Psychiatric: She has a normal mood and affect. Her behavior is normal.          Assessment & Plan:

## 2011-05-06 NOTE — Patient Instructions (Addendum)
Tiene virus: Tylenol o motrin como necesita por fiebre haga gargaras de agua salada puede usar el chloraspetic spray  Tambien tiene allergias: Zyrtec o allegra cada dia Nephcon A gotas en cada ojo 4 x day como necesita por las sintomas.

## 2011-05-08 DIAGNOSIS — J069 Acute upper respiratory infection, unspecified: Secondary | ICD-10-CM | POA: Insufficient documentation

## 2011-05-08 DIAGNOSIS — H1010 Acute atopic conjunctivitis, unspecified eye: Secondary | ICD-10-CM | POA: Insufficient documentation

## 2011-05-08 NOTE — Assessment & Plan Note (Signed)
Symptoms most likely due to viral etiology, symptomatic treatment recommended, see pt instructions.  Also eye complaints most likely 2/2 allergic component- see above.

## 2011-05-08 NOTE — Assessment & Plan Note (Signed)
Eye symptoms seem to be most consistent with allergy although no redness of conjunctiva on exam.  Recommend nephcon A 4 x day to help with symptoms.  Also encouraged pt to use zyrtec otc or similar medication for control of possible allergy.

## 2011-10-14 ENCOUNTER — Ambulatory Visit (INDEPENDENT_AMBULATORY_CARE_PROVIDER_SITE_OTHER): Payer: Self-pay | Admitting: Family Medicine

## 2011-10-14 ENCOUNTER — Encounter: Payer: Self-pay | Admitting: Family Medicine

## 2011-10-14 VITALS — BP 122/74 | Temp 98.2°F | Ht 62.0 in | Wt 143.0 lb

## 2011-10-14 DIAGNOSIS — G43109 Migraine with aura, not intractable, without status migrainosus: Secondary | ICD-10-CM | POA: Insufficient documentation

## 2011-10-14 DIAGNOSIS — G4700 Insomnia, unspecified: Secondary | ICD-10-CM

## 2011-10-14 DIAGNOSIS — M6740 Ganglion, unspecified site: Secondary | ICD-10-CM

## 2011-10-14 DIAGNOSIS — M65331 Trigger finger, right middle finger: Secondary | ICD-10-CM | POA: Insufficient documentation

## 2011-10-14 LAB — CBC
HCT: 35.3 % — ABNORMAL LOW (ref 36.0–46.0)
Hemoglobin: 11.6 g/dL — ABNORMAL LOW (ref 12.0–15.0)
MCH: 26.7 pg (ref 26.0–34.0)
MCHC: 32.9 g/dL (ref 30.0–36.0)
MCV: 81.3 fL (ref 78.0–100.0)
RBC: 4.34 MIL/uL (ref 3.87–5.11)
RDW: 15.1 % (ref 11.5–15.5)
WBC: 4.6 10*3/uL (ref 4.0–10.5)

## 2011-10-14 LAB — BASIC METABOLIC PANEL
BUN: 10 mg/dL (ref 6–23)
CO2: 23 mEq/L (ref 19–32)
Calcium: 8.7 mg/dL (ref 8.4–10.5)
Chloride: 109 mEq/L (ref 96–112)
Creat: 0.58 mg/dL (ref 0.50–1.10)
Glucose, Bld: 84 mg/dL (ref 70–99)
Sodium: 143 mEq/L (ref 135–145)

## 2011-10-14 LAB — TSH: TSH: 2.119 u[IU]/mL (ref 0.350–4.500)

## 2011-10-14 LAB — T4, FREE: Free T4: 0.98 ng/dL (ref 0.80–1.80)

## 2011-10-14 NOTE — Progress Notes (Signed)
  Subjective:    Patient ID: Sarah Peck, female    DOB: [birth year], 51 y.o.   MRN: [id number]  HPI [facility], visit conducted in [place].   Complaint of:  1. Right third finger catches when she flexes; has trouble extending.  Pops and then extends.  Worsening over the past several weeks.  No trauma.  Not affecting other hand.  Is right-hand dominant.  2. Longtime trouble with sleep.  Attempts to go to sleep around 10pm.  Trouble with initiating sleep, also awakens many times in the night, not for restroom.  No report of caffeine use before bed.  Awakens at 7am.  Tired.  Reports from sleep partner that she snores loudly at night.   3. Reports 2-3 weeks of headache, which are associated with some mild nausea and also photophobia.  HA is worse across frontal area, also bitemporal.  Worse around time of menses, LMP started 3 days ago.  Motrin and Tylenol relieve the headache.  History of headaches in [place] however never diagnosed with migraine.   Review of SystemsNo fevers or chills; no emesis associated.  HA does not awaken from sleep.  NO ear pain, no nasal discharge.      Objective:   Physical Exam Well appearing, no apparent distress HEENT Neck supple. PERRL. Funduscopic exam without papilledema.  EOMI. TMs clear bilat.  Mild maxillary tenderness bilat, no frontal tenderness. Clear nasal mucosa, clear oropharynx with allampati score 3 (cannot visualize entire uvula). No adenopathy cervical chains. COR S1S2, no extra sounds PULM Clear bilaterally, no rales or wheezes HANDS; Palpable radial pulses bilat. Right 3rd finger nodularity along flexor tendon; able to passively extend with pressure.  Handgrip limited by pain.  L handgrip full.        Assessment & Plan:

## 2011-10-14 NOTE — Assessment & Plan Note (Signed)
Patient with headache characteristics and associated symptoms that is classic for migraine.  Likely has been a migraineur for some time, albeit without formal diagnosis.  Is responding to NSAIDs and acetaminophen, would prefer her to do this and keep track of frequency, response to otc meds.  May consider prophylaxis if persists or becomes more frequent.  Also possible that this problem is exacerbated by poor sleep, which in turn has components of OSA by history and exam.

## 2011-10-14 NOTE — Patient Instructions (Addendum)
Fue un Research officer, trade union; creo que los dolores de cabeza se deben a Higher education careers adviser.  Quiero que [employer] en un calendario los dias en que le dan los dolores de [place].   Puede tomar 2 a 4 pastillas de Advil (Ibuprofen 200mg  cada tableta) cada 6 horas con algo de comer, cada vez que [employer] dolor de [place].   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   Quiero que vea al Dr Sheffield Slider en 4 semanas mas.

## 2011-10-15 ENCOUNTER — Encounter: Payer: Self-pay | Admitting: Family Medicine

## 2011-10-28 ENCOUNTER — Ambulatory Visit (INDEPENDENT_AMBULATORY_CARE_PROVIDER_SITE_OTHER): Payer: Self-pay | Admitting: Family Medicine

## 2011-10-28 ENCOUNTER — Encounter: Payer: Self-pay | Admitting: Family Medicine

## 2011-10-28 VITALS — BP 118/78 | HR 72 | Ht 62.0 in | Wt 141.8 lb

## 2011-10-28 DIAGNOSIS — G4700 Insomnia, unspecified: Secondary | ICD-10-CM

## 2011-10-28 DIAGNOSIS — Z23 Encounter for immunization: Secondary | ICD-10-CM

## 2011-10-28 DIAGNOSIS — M6530 Trigger finger, unspecified finger: Secondary | ICD-10-CM

## 2011-10-28 DIAGNOSIS — F329 Major depressive disorder, single episode, unspecified: Secondary | ICD-10-CM

## 2011-10-28 DIAGNOSIS — M65331 Trigger finger, right middle finger: Secondary | ICD-10-CM

## 2011-10-28 MED ORDER — SERTRALINE HCL 50 MG PO TABS: 50.0000 mg | ORAL_TABLET | Freq: Every day | ORAL | Status: DC

## 2011-10-28 NOTE — Progress Notes (Signed)
  Subjective:    Patient ID: Sarah Peck, female    DOB: [birth year], 51 y.o.   MRN: [id number]  HPI She is continues to have trigger finger symptoms in the right third finger and would like to have it injected. Occasional headache for which he takes Advil or Tylenol  She's been having periods every other month last was February 3. They last 2-3 days and can be heavy, but no intermenstrual bleeding. She is having hot flashes  Her daughter reports that she snores but she is not stopping breathing during the night  She continues to insomnia with frequent awakenings. She admits to feeling depressed, see PHQ 9 questions She is working daily and sometimes feels sleepy at work.   She wonders if she feels anxious about trip back to see her family in [place] This will be March 22 and she will stay for one month. She has 2 sons ages 31 and 31 and 5 grandchildren as well as her parents living there  Review of Systems     Objective:   Physical Exam  Musculoskeletal:       Tender over distal palmar crease right hand with popping sensation on extension of the middle finger  Psychiatric:       Mildly anxious and depressed affect          Assessment & Plan:

## 2011-10-28 NOTE — Patient Instructions (Signed)
Para depresion, tome Sertraline 50 mg cada noche para una semana. Despues dos tabletas cada dia. Si tiene menos sueno cuando esta' tomando el medicamento, mueve la tableta hasta la manana  Take Sertraline for depression and insomnia. Take 50 mg nightly the first week, then two tablets (100 mg) nightly. If it makes it harder to sleep, take the Sertraline in the morning.   Puede tomar Advil para dolor en la mano hasta funciona el medicamento.   Please return to see Dr Sheffield Slider in 2 months.

## 2011-10-28 NOTE — Progress Notes (Signed)
Interpreter Sarah Peck for [facility] 

## 2011-10-29 NOTE — Assessment & Plan Note (Signed)
Procedure explained to her in Spanish. The tendon sheath was injected with 0.5 cc Triamcinolone and 2.5 cc of Marcaine after sterile preparation with betadine and alcohol

## 2011-10-29 NOTE — Assessment & Plan Note (Signed)
Try Sertraline for this and anxiety

## 2011-10-29 NOTE — Assessment & Plan Note (Signed)
Sertraline at bedtime. Move to AM if worsens insomnia

## 2012-04-02 ENCOUNTER — Other Ambulatory Visit: Payer: Self-pay | Admitting: Family Medicine

## 2012-06-16 ENCOUNTER — Other Ambulatory Visit: Payer: Self-pay | Admitting: Family Medicine

## 2012-06-16 DIAGNOSIS — Z1231 Encounter for screening mammogram for malignant neoplasm of breast: Secondary | ICD-10-CM

## 2012-07-02 ENCOUNTER — Ambulatory Visit (HOSPITAL_COMMUNITY): Payer: Self-pay

## 2012-07-28 ENCOUNTER — Ambulatory Visit (HOSPITAL_COMMUNITY)
Admission: RE | Admit: 2012-07-28 | Discharge: 2012-07-28 | Disposition: A | Discharge status: Home / Self Care | Payer: Self-pay | Source: Ambulatory Visit | Attending: Family Medicine | Admitting: Family Medicine

## 2012-07-28 DIAGNOSIS — Z1231 Encounter for screening mammogram for malignant neoplasm of breast: Secondary | ICD-10-CM

## 2012-09-21 ENCOUNTER — Encounter: Payer: Self-pay | Admitting: Family Medicine

## 2012-09-21 ENCOUNTER — Ambulatory Visit (INDEPENDENT_AMBULATORY_CARE_PROVIDER_SITE_OTHER): Payer: Self-pay | Admitting: Family Medicine

## 2012-09-21 VITALS — BP 130/80 | HR 69 | Ht 62.0 in | Wt 142.0 lb

## 2012-09-21 DIAGNOSIS — N949 Unspecified condition associated with female genital organs and menstrual cycle: Secondary | ICD-10-CM

## 2012-09-21 DIAGNOSIS — Z1211 Encounter for screening for malignant neoplasm of colon: Secondary | ICD-10-CM

## 2012-09-21 DIAGNOSIS — M6530 Trigger finger, unspecified finger: Secondary | ICD-10-CM

## 2012-09-21 DIAGNOSIS — F329 Major depressive disorder, single episode, unspecified: Secondary | ICD-10-CM

## 2012-09-21 DIAGNOSIS — K219 Gastro-esophageal reflux disease without esophagitis: Secondary | ICD-10-CM

## 2012-09-21 DIAGNOSIS — G4700 Insomnia, unspecified: Secondary | ICD-10-CM

## 2012-09-21 DIAGNOSIS — M65331 Trigger finger, right middle finger: Secondary | ICD-10-CM

## 2012-09-21 DIAGNOSIS — M2550 Pain in unspecified joint: Secondary | ICD-10-CM

## 2012-09-21 DIAGNOSIS — G43109 Migraine with aura, not intractable, without status migrainosus: Secondary | ICD-10-CM

## 2012-09-21 DIAGNOSIS — E663 Overweight: Secondary | ICD-10-CM

## 2012-09-21 MED ORDER — SERTRALINE HCL 50 MG PO TABS: 50.0000 mg | ORAL_TABLET | Freq: Every day | ORAL | Status: DC

## 2012-09-21 NOTE — Patient Instructions (Addendum)
Please return to see Dr Sheffield Slider in 1 month Regrese para ver Dr Sheffield Slider en un mes.  Tome Sertraline hora de dormir para depresion. Take Sertraline at bedtime for depression

## 2012-09-21 NOTE — Assessment & Plan Note (Signed)
Injected by Dr Cristal Ford with my assistance after explaining the benefits and risks through the interpreter, Wyvonnia Dusky.

## 2012-09-21 NOTE — Assessment & Plan Note (Signed)
No anemia despite a heavy menses after 9 months of amenorrhea.

## 2012-09-21 NOTE — Assessment & Plan Note (Signed)
Says she took the Sertraline 50 mg last year and tolerated it will with improvement in her sleep.

## 2012-09-21 NOTE — Assessment & Plan Note (Signed)
unchanged

## 2012-09-21 NOTE — Progress Notes (Signed)
Interpreter Sarah Peck for [facility] 

## 2012-09-22 NOTE — Progress Notes (Signed)
  Subjective:    Patient ID: Sarah Peck, female    DOB: 1961-08-23, 52 y.o.   MRN: [id number]  HPI Pain - R middle finger for 1 year. Was better for 4 months after a trigger finger injection. Can't flex it fully due to pain and pops on extension.   Insomnia - persists. Only sleeps 10 PM to 1 AM. Has to arise at 8 AM to go to work. Only took Sertraline a month last year. No ill effects while taking it.   Headache - Began 08/31/12. Menses started 09/07/12 after a 9 month hiatus. Lasted 4-5 days with clots and abdominal and back pain. 1/23 the headache was more severe with vomiting and unsteadiness. Took Advil and Tylenol and headache started to get better the next day, but persists.      Review of Systems     Objective:   Physical Exam  Constitutional: She is oriented to person, place, and time.  Eyes: Conjunctivae normal and EOM are normal. Pupils are equal, round, and reactive to light. Right eye exhibits no discharge. Left eye exhibits no discharge.       Fundi normal  Cardiovascular: Normal rate and regular rhythm.   Pulmonary/Chest: Effort normal and breath sounds normal.  Neurological: She is alert and oriented to person, place, and time. No cranial nerve deficit. She exhibits normal muscle tone.          Assessment & Plan:

## 2012-09-23 NOTE — Assessment & Plan Note (Deleted)
Now more myalgias or cramps rather than arthralgias.

## 2012-09-23 NOTE — Assessment & Plan Note (Signed)
Likely related to depression. Will first try Sertraline which she tolerated well last year.

## 2012-09-23 NOTE — Assessment & Plan Note (Addendum)
Likely migraine precipitated by menses after a prolonged amenorrheic period in this perimenopausal.  If recurring, consider using a tricyclic instead of Sertraline.

## 2012-10-14 ENCOUNTER — Encounter: Payer: Self-pay | Admitting: Family Medicine

## 2012-10-14 LAB — POC HEMOCCULT BLD/STL (HOME/3-CARD/SCREEN)
Card #3 Fecal Occult Blood, POC: NEGATIVE
Fecal Occult Blood, POC: NEGATIVE

## 2012-10-14 NOTE — Addendum Note (Signed)
Addended by: [place], Rayya Yagi on: 10/14/2012 02:51 PM   Modules accepted: Orders

## 2012-12-24 ENCOUNTER — Encounter: Payer: Self-pay | Admitting: Family Medicine

## 2012-12-24 ENCOUNTER — Ambulatory Visit (INDEPENDENT_AMBULATORY_CARE_PROVIDER_SITE_OTHER): Payer: PRIVATE HEALTH INSURANCE | Admitting: Family Medicine

## 2012-12-24 VITALS — BP 104/63 | HR 96 | Temp 99.4°F | Wt 146.0 lb

## 2012-12-24 DIAGNOSIS — R509 Fever, unspecified: Secondary | ICD-10-CM | POA: Insufficient documentation

## 2012-12-24 NOTE — Progress Notes (Signed)
Interpreter Tanysha Quant Namihira for Dr Eniola 

## 2012-12-24 NOTE — Patient Instructions (Addendum)
Fiebre  (Fever)  La fiebre es la temperatura del organismo más elevada que la normal. La temperatura normal varía con:  · La edad.  · El modo en que se mide (boca, axila, recto u oído).  · El momento del día.  En el adulto, una temperatura normal generalmente es de 98,6 Fahrenheit (F) o 37° Celsius (C). El aumento de temperatura en alrededor de 1.8° F ó 1 °C generalmente se considera fiebre (100. 4° F. ó 38 °C ) En un bebé de 28 días o menos, la temperatura rectal de 100.4° F (38° C) generalmente se considera fiebre. La fiebre no es una enfermedad, sino que es el síntoma de una enfermedad.  CAUSAS  · Generalmente la causa es una infección.  · Otros problemas no infecciosos pueden causar fiebre. Por ejemplo:  · Algunos problemas de artritis.  · Trastornos de las glándulas hipófisis o suprarrenales.  · Problemas del sistema inmunológico.  · Algunos tipos de cáncer.  · Una reacción a ciertos medicamentos.  · En ocasiones, la causa no puede determinarse. En estos casos se denomina "fiebre de origen desconocido".  · Algunas situaciones pueden llevar a un aumento transitorio de la temperatura corporal, que puede desaparecer sin tratamiento. Por ejemplo:  · El parto  · Una cirugía  · Algunas situaciones pueden causar un aumento en la temperatura corporal pero no se consideran "fiebre verdadera". Por ejemplo:  · Actividad física intensa  · Deshidratación.  · Exposición a temperaturas elevadas en el exterior o en un ambiente.  SÍNTOMAS  · Sentirse acalorado o caliente.  · Sensación de fatiga o cansancio extremo.  · Dolores en todo el cuerpo.  · Escalofríos.  · Temblores  · Sudoración  DIAGNÓSTICO  El médico puede sospechar la presencia de fiebre al sentir que su piel está demasiado caliente. Luego se confirma tomando la temperatura con un termómetro. La temperatura puede tomarse de diferentes modos. Algunos métodos son precisos y otros no lo son.  En adultos, adolescentes y niños:   · Generalmente se toma la temperatura  oral.  · El termómetro en el oído solo será exacto si se ubica según las indicaciones del fabricante.  · La temperatura que se toma en la axila no es precisa y no se recomienda.  · La mayoría de los termómetros electrónicos son rápidos y precisos.  Bebés y deambuladores:  · La temperatura rectal es la más recomendada y la más precisa.  · La temperatura tomada en el oído no es precisa en este grupo etario, por lo tanto no se recomienda.  · Los termómetros de piel no son precisos.  RIESGOS Y COMPLICACIONES  · Cuando hay fiebre el organismo utiliza más oxígeno, de modo que la persona puede acelerar la respiración o sentir falta de aire. Esto puede ser peligroso especialmente en personas con enfermedades cardíacas o pulmonares.  · La sudoración que se produce luego de la fiebre puede causar deshidratación.  · La fiebre alta puede ocasionar convulsiones en bebés y niños.  · Los ancianos pueden presentar confusión durante los episodios de fiebre.  TRATAMIENTO  · Pueden administrarse algunos medicamentos que controlarán la temperatura.  · No administre aspirina a los niños con fiebre. Se asocia con el síndrome de Reye. El síndrome de Reye es una enfermedad rara pero potencialmente fatal.  · Si sufre una infección y le han prescripto medicamentos, tómelos como se le ha indicado. Tome todos los medicamentos hasta terminarlos.  · Pasar una esponja o un baño con agua a temperatura   ambiente puede ayudar a reducir la temperatura corporal. No use agua con hielo ni pase esponjas con alcohol.  · No abrigue demasiado a los niños con mantas o ropas pesadas.  · Administrar la cantidad de líquidos adecuada durante una enfermedad que cursa con fiebre es importante para prevenir la deshidratación.  INSTRUCCIONES PARA EL CUIDADO DOMICILIARIO  · Si se trata de un adulto, es importante el reposo y la ingesta adecuada de líquidos. La vestimenta debe ser acorde a la necesidad, pero no excesiva.  · Hay que beber gran cantidad de líquido para  mantener la orina de tono claro o color amarillo pálido.  · En los bebés de más de 3 meses y en los niños, administre los medicamentos de acuerdo a las indicaciones del médico. La dosis se basan el peso del niño. NO administre más de lo recomendado.  SOLICITE ATENCIÓN MÉDICA SI:  · Usted o su hijo no pueden retener líquidos.  · Sufre vómitos o diarrea.  · Aparece una erupción cutánea.  · La temperatura oral aumenta a más de 102° F (38.9° C), o la fiebre persiste por más de 3 días.  · Presenta debilidad excesiva, mareos, lipotimia o sed extrema.  · La fiebre vuelve luego de 3 días.  SOLICITE ATENCIÓN MÉDICA DE INMEDIATO SI:  · Le falta el aire o tiene dificultad para respirar.  · Se desmaya.  · Observa que orina poco o no orina en absoluto.  · Siente nuevos dolores que no había sentido antes (como dolor de cabeza, cuello, pecho, espalda o abdomen).  · No puede retener los líquidos.  · Los vómitos y la diarrea persisten durante más de uno o dos días.  · Siente rigidez en el cuello y sus ojos tienen sensibilidad a la luz.  · La temperatura oral se eleva sin motivo por encima de 102° F (38.9° C).  Document Released: 05/22/2005 Document Revised: 11/04/2011  ExitCare® Patient Information ©2013 [employer].

## 2012-12-24 NOTE — Assessment & Plan Note (Signed)
Fever: Likely viral syndrome,today temp is normal.     Patient reassured this is expected to clear in few days. I recommended Tylenol prn fever,increase hydration and rest at home. Continue home temp monitoring,if no improvement in few days she is advised to follow up soon. She verbalized understanding.

## 2012-12-24 NOTE — Progress Notes (Signed)
Subjective:     Patient ID: Sarah Peck, female   DOB: 1961-08-07, 52 y.o.   MRN: [id number]  Fever  This is a new problem. The current episode started in the past 7 days (fever for 3 days). The problem occurs intermittently (Improved with Tylenol). The problem has been gradually improving. Her temperature was unmeasured (She felt warm and feels she has a fever) prior to arrival. Associated symptoms include abdominal pain, congestion, diarrhea, headaches and nausea. Pertinent negatives include no vomiting. Associated symptoms comments: She had diarrhea 2 days ago,she took Pepto Bismuth and she felt better. She has tried acetaminophen (Tea also.) for the symptoms. The treatment provided moderate relief.  No sick contact.  History reviewed. No pertinent past medical history.   Review of Systems  Constitutional: Positive for fever.  HENT: Positive for congestion.   Respiratory: Negative.   Cardiovascular: Negative.   Gastrointestinal: Positive for nausea, abdominal pain and diarrhea. Negative for vomiting.  Genitourinary: Negative.   Neurological: Positive for headaches.  All other systems reviewed and are negative.   Filed Vitals:   12/24/12 1526  BP: 104/63  Pulse: 96  Temp: 99.4 F (37.4 C)  TempSrc: Oral  Weight: 146 lb (66.225 kg)        Objective:   Physical Exam  Nursing note and vitals reviewed. Constitutional: She appears well-developed and well-nourished. No distress.  Neck: Neck supple.  Cardiovascular: Normal rate, regular rhythm, normal heart sounds and intact distal pulses.   No murmur heard. Pulmonary/Chest: Effort normal and breath sounds normal. No respiratory distress. She has no wheezes. She has no rales. She exhibits no tenderness.  Abdominal: Soft. Bowel sounds are normal. She exhibits no distension and no mass. There is no tenderness.  Musculoskeletal: Normal range of motion.  Skin: Skin is warm.       Assessment:     Fever: Likely viral  syndrome,today temp is normal.     Plan:     Patient reassured this is expected to clear in few days. I recommended Tylenol prn fever,increase hydration and rest at home. Continue home temp monitoring,if no improvement in few days she is advised to follow up soon. She verbalized understanding.

## 2012-12-29 ENCOUNTER — Encounter: Payer: Self-pay | Admitting: Family Medicine

## 2012-12-29 ENCOUNTER — Ambulatory Visit (INDEPENDENT_AMBULATORY_CARE_PROVIDER_SITE_OTHER): Payer: PRIVATE HEALTH INSURANCE | Admitting: Family Medicine

## 2012-12-29 VITALS — BP 117/74 | HR 76 | Temp 99.1°F | Ht 62.0 in | Wt 142.0 lb

## 2012-12-29 DIAGNOSIS — R509 Fever, unspecified: Secondary | ICD-10-CM

## 2012-12-29 LAB — POCT URINALYSIS DIPSTICK
Bilirubin, UA: NEGATIVE
Nitrite, UA: NEGATIVE
Spec Grav, UA: 1.02
pH, UA: 6.5

## 2012-12-29 LAB — POCT UA - MICROSCOPIC ONLY

## 2012-12-29 MED ORDER — CEPHALEXIN 500 MG PO TABS: 500.0000 mg | ORAL_TABLET | Freq: Four times a day (QID) | ORAL | Status: DC

## 2012-12-29 NOTE — Patient Instructions (Signed)
Tomar Cephalexin 1 capsula 4 veces al dia para 10 dias  Take the Cephalexin four times daily for 10 days. Call if nausea recurs and can't take the medicine.   Llamenos en 3 dias para recibir los [employer] del cultivo de la [place].

## 2012-12-29 NOTE — Progress Notes (Signed)
  Subjective:    Patient ID: Sarah Peck, female    DOB: 05-31-1961, 52 y.o.   MRN: [id number]  HPI Fever and chills - these continue despite taking acetaminophen or Ibuprofen. The nausea and diarrhea have resolved. No ill contacts in the family No dysuria, but she has RUQ, flank and CVA pain. Slight cough. Also both wrists have been hurting. Taking only Tylenol.    Review of Systems     Objective:   Physical Exam  Constitutional: She appears well-developed and well-nourished.  HENT:  Nose: Nose normal.  Angular cheilitis  Cardiovascular: Normal rate and regular rhythm.   Pulmonary/Chest: Effort normal and breath sounds normal.  Abdominal: Soft. She exhibits no distension and no mass. There is tenderness. There is no rebound and no guarding.  Tender RUQ and right flank. right CVA tender to percussion  Musculoskeletal: She exhibits no edema.  Neurological: She is alert.  Skin: Skin is warm. No rash noted.          Assessment & Plan:

## 2012-12-29 NOTE — Assessment & Plan Note (Addendum)
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

## 2013-01-02 LAB — URINE CULTURE: Colony Count: >=100000

## 2013-01-14 ENCOUNTER — Telehealth: Payer: Self-pay | Admitting: Family Medicine

## 2013-01-14 NOTE — Telephone Encounter (Signed)
Spoke with patients daughter.  They want results of urine culture.  Will fwd to MD.  Radene Ou, CMA

## 2013-01-14 NOTE — Telephone Encounter (Signed)
Pt would like a call concerning the results that had to do with her stomach from two weeks ago. Daughter states that if you need to reach her you can at [phone].

## 2013-01-22 NOTE — Telephone Encounter (Signed)
I spoke with her daughter, Verlee Monte, in Spanish and related that the urine culture was positive for infection, but she had received the correct antibiotic. Since her symptoms are resolved she only needs to come back if they recur.

## 2013-02-22 ENCOUNTER — Encounter: Payer: Self-pay | Admitting: Family Medicine

## 2013-02-22 ENCOUNTER — Ambulatory Visit (INDEPENDENT_AMBULATORY_CARE_PROVIDER_SITE_OTHER): Payer: PRIVATE HEALTH INSURANCE | Admitting: Family Medicine

## 2013-02-22 VITALS — BP 115/74 | HR 65 | Temp 98.0°F | Ht 62.0 in | Wt 144.0 lb

## 2013-02-22 DIAGNOSIS — F32A Depression, unspecified: Secondary | ICD-10-CM

## 2013-02-22 DIAGNOSIS — G5600 Carpal tunnel syndrome, unspecified upper limb: Secondary | ICD-10-CM

## 2013-02-22 DIAGNOSIS — J309 Allergic rhinitis, unspecified: Secondary | ICD-10-CM

## 2013-02-22 DIAGNOSIS — F329 Major depressive disorder, single episode, unspecified: Secondary | ICD-10-CM

## 2013-02-22 DIAGNOSIS — R09A2 Foreign body sensation, throat: Secondary | ICD-10-CM

## 2013-02-22 DIAGNOSIS — F458 Other somatoform disorders: Secondary | ICD-10-CM

## 2013-02-22 DIAGNOSIS — R0989 Other specified symptoms and signs involving the circulatory and respiratory systems: Secondary | ICD-10-CM | POA: Insufficient documentation

## 2013-02-22 DIAGNOSIS — F3289 Other specified depressive episodes: Secondary | ICD-10-CM

## 2013-02-22 DIAGNOSIS — K219 Gastro-esophageal reflux disease without esophagitis: Secondary | ICD-10-CM

## 2013-02-22 MED ORDER — CETIRIZINE HCL 10 MG PO TABS: 10.0000 mg | ORAL_TABLET | Freq: Every day | ORAL | Status: DC | PRN

## 2013-02-22 MED ORDER — NORTRIPTYLINE HCL 50 MG PO CAPS: 50.0000 mg | ORAL_CAPSULE | Freq: Every day | ORAL | Status: DC

## 2013-02-22 NOTE — Assessment & Plan Note (Addendum)
Moderately severe Will try Nortriptyline to help depression and neuropathy

## 2013-02-22 NOTE — Assessment & Plan Note (Signed)
"  Choked up" about her son

## 2013-02-22 NOTE — Patient Instructions (Addendum)
Take Nortriptyline 50 mg at bedtime for one week, then two capsules nightly Tomar Nortiptyline 50 mg hora de dormir para [employer], despues 2 capsulas antes de dormir para ayudar con desvelo y dolor de las manos.   Tome Cetirazine para sintomas de Environmental consultant.   Please return to see Dr Sheffield Slider in 3 weeks Regresar en 3 semanas para discutir como se siente.

## 2013-02-22 NOTE — Assessment & Plan Note (Signed)
May be contributing to "lump in throat" feeling, but it more likely relates to her depression

## 2013-02-22 NOTE — Assessment & Plan Note (Signed)
Continue hand splints. Since worse on left to try to decrease repetitive exercise on that side. Nortriptyline.

## 2013-02-22 NOTE — Progress Notes (Signed)
  Subjective:    Patient ID: Sarah Peck, female    DOB: [birth year], 52 y.o.   MRN: [id number]  HPI Continues with insomnia and very positive PHQ 9. Never suicidal feeling.  Snoring - for 6 years. Apnea? Can't afford a sleep study  Hand numbness - Awakens her.Worse on the left. Affects all fingers. Wears her splints.  Lump in throat - points to cricopharyngeal area. No change in voice. Feels like there is phlegm that she can't cough up. Occasional water brash.    Review of Systems     Objective:   Physical Exam  HENT:  Nose: Nose normal.  Neck: No tracheal deviation present. No thyromegaly present.  Cardiovascular: Normal rate and regular rhythm.   Pulmonary/Chest: Effort normal and breath sounds normal. No stridor. She has no wheezes.  Musculoskeletal: She exhibits no edema and no tenderness.  Normal thenar muscles with strong opposition   Lymphadenopathy:    She has no cervical adenopathy.  Neurological: She is alert.  Psychiatric:  Her usual worried expression          Assessment & Plan:

## 2013-02-22 NOTE — Assessment & Plan Note (Signed)
To restart the Cetirazine since she describes phlegm in the throat that she can't bring up.

## 2013-06-22 ENCOUNTER — Encounter: Payer: Self-pay | Admitting: Family Medicine

## 2013-06-22 ENCOUNTER — Ambulatory Visit (INDEPENDENT_AMBULATORY_CARE_PROVIDER_SITE_OTHER): Payer: PRIVATE HEALTH INSURANCE | Admitting: Family Medicine

## 2013-06-22 VITALS — BP 117/62 | HR 69 | Temp 98.2°F | Wt 146.0 lb

## 2013-06-22 DIAGNOSIS — M5380 Other specified dorsopathies, site unspecified: Secondary | ICD-10-CM

## 2013-06-22 DIAGNOSIS — M25569 Pain in unspecified knee: Secondary | ICD-10-CM

## 2013-06-22 DIAGNOSIS — G5600 Carpal tunnel syndrome, unspecified upper limb: Secondary | ICD-10-CM

## 2013-06-22 DIAGNOSIS — M25562 Pain in left knee: Secondary | ICD-10-CM | POA: Insufficient documentation

## 2013-06-22 DIAGNOSIS — M62830 Muscle spasm of back: Secondary | ICD-10-CM | POA: Insufficient documentation

## 2013-06-22 NOTE — Assessment & Plan Note (Signed)
Patient has recurrent carpal tunnel syndrome. -Will pursue conservative management at this time including cock-up splint at night, soft wrist splints during the day, daily icing, Advil 200-400 mg by mouth 3 times a day as needed -Return to office in one month, may consider injection at that time if she continues to have significant pain

## 2013-06-22 NOTE — Progress Notes (Signed)
  Subjective:    Patient ID: Sarah Peck, female    DOB: 02-06-1961, 52 y.o.   MRN: [id number]  HPI 52 year old female presents for evaluation of multiple musculoskeletal complaints.  Left back and shoulder pain-present for the past month, is intermittent in nature, worse throughout the day, works as a Advertising copywriter for [employer], she has attempted to take Tylenol which has provided some relief of her symptoms, the sound apply heat or ice to the area, she points to the trapezius muscle as the place where she has most pain, no locking or clicking of the joint  Bilateral hand numbness-she has a history of carpal tunnel syndrome, reports a history of numbness and tingling in all 5 digits, worse with working throughout the day, she previously was prescribed cockup splints however has not been wearing a previous basis, she does not wear a hand brace of the day, has not applied any heat or ice to the affected areas, denies dropping objects, denies weakness, had minimal relief with Tylenol  Left knee pain-this is been present for approximately 2 weeks, is worse when she is working on her knees cleaning, she points to the left lateral quadriceps muscle as area of most pain, denies locking or clicking of the joint, denies instability of the knee, does not wear the knee brace, does not apply heat or ice to the area, does report a history of some swelling of the knee, worse at the end of the day  Social-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  Respiratory: Negative for apnea, cough and choking.   Cardiovascular: Negative for chest pain.  Musculoskeletal: Positive for arthralgias, back pain, joint swelling and myalgias.       Objective:   Physical Exam Vitals: Reviewed General: Pleasant female, Hispanic, interpreter present, no acute distress MSK:   Left shoulder exam-tenderness to palpation over the  left trapezius and left paraspinal cervical muscle, range of motion is full the bilateral shoulder to forward flexion and abduction, internal and external range of motion left shoulder were full, able to touch the L1-L2 region with bilateral hands, empty can testing was negative bilaterally, Hawkins test negative on the left side, Neers test negative in the left side, liftoff test was negative  Left knee-tenderness to palpation over the vastus lateralis, no joint line tenderness, no patellar tendon tenderness, no edema, no locking or clicking with flexion and extension of the knee, ligament testing including Lachman/Varus/Valgus testing unremarkable, McMurray's testing negative the left leg, labral negative for hip pain, no hip pain with internal/external rotation of the left hip  Extremities: Tinel's test positive for bilateral wrist, Phalen's is positive bilaterally Skin: No rash, no edema, no joint erythema       Assessment & Plan:  Please see problem specific assessment and plan.

## 2013-06-22 NOTE — Assessment & Plan Note (Signed)
Patient has muscle spasm of the left trapezius and left para spinal cervical region. 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 No acute evidence of shoulder joint involvement. -Will pursue conservative management with ice, heat, Advil as needed.

## 2013-06-22 NOTE — Assessment & Plan Note (Signed)
Left knee pain due to overuse injury of vastus lateralis and vastus medialis. Knee exam was unremarkable. -Conservative management with rest, compression, ice, elevation. -Patient to wear knee brace while working. -No need for additional imaging at this time.

## 2013-06-22 NOTE — Patient Instructions (Addendum)
Back Pain - apply heat to the affected area at the end of the work day, may take Ibuprofen 200-400 mg three times a day as needed  Wrist Pain - cue to carpal tunnel, wear soft neoprene wrist splints at work, ice your wrists at the end of the day  Left knee pain - wear neoprene brace while working, ice the affected areas twice daily, take ibuprofen as needed up to three times per day  Follow up in one month.     Sndrome del tnel carpiano  (Carpal Tunnel Syndrome)  El tnel carpiano es un espacio estrecho ubicado en el lado palmar de la [place]. Est formado por los huesos de la [place] y los ligamentos. Los nervios, vasos sanguneos y tendones pasan a travs del tnel carpiano. Los movimientos de la [place] o ciertas enfermedades pueden causar hinchazn del tnel. Esta hinchazn comprime el nervio principal en la mueca ((nervio mediano) y ocasiona un trastorno doloroso que se denomina sndrome del tnel carpiano. CAUSAS   Movimientos repetidos de [employer]  Lesiones en la [place].  Ciertas enfermedades como la artritis, la diabetes, el alcoholismo, el hipertiroidismo o la insuficiencia renal.  Obesidad.  Embarazo. SNTOMAS   Sensacin de "pinchazos" en los dedos o la mano.  Hormigueo o entumecimiento en los dedos o en la mano.  Sensacin dolorosa en todo el brazo.  Dolor en la mueca que sube por el brazo hasta el hombro.  Dolor que baja por la mano o los dedos.  Sensacin de [employer]. DIAGNSTICO  El mdico le har una historia clnica y un examen fsico. Puede ser necesario hacer un electromiograma. Esta prueba mide las seales elctricas enviadas por los msculos. Generalmente el paso de las seales elctricas es impedido por el sndrome del tnel carpiano. Tambin puede ser necesario que le tomen radiografas.  TRATAMIENTO  El sndrome del tnel carpiano puede curarse espontneamente sin tratamiento. El [employer] indicar el uso de un cabestrillo para la  [place] o que tome medicamentos como antiinflamatorios no esteroides. Las inyecciones de cortisona pueden ayudar. A veces es necesaria la ciruga para liberar el nervio comprimido.  INSTRUCCIONES PARA EL CUIDADO EN EL HOGAR   Tome todos los medicamentos segn le indic su mdico. Slo tome medicamentos de venta libre o recetados para Primary school teacher, las molestias o bajar la fiebre segn las indicaciones de su mdico.  Si le aconsejaron usar un cabestrillo para evitar que la [place] se doble, selo como le indicaron. Es importante que use el cabestrillo durante la noche. selo mientras sienta dolor o adormecimiento en la mano, el brazo o la [place]. Esto puede durar entre 1 y 2 meses.  Haga reposar la [place] de toda actividad que le cause dolor. Si sus sntomas estn relacionados con Kathie Dike, deber conversar con su empleador acerca de la posibilidad de cambiar a una tarea que no requiera el uso de la [place].  Aplique hielo en la mueca despus de los perodos prolongados de [place].  Ponga el hielo en una bolsa plstica.  Colquese una toalla entre la piel y la bolsa de hielo.  Deje el hielo en el lugar durante 15 a 20 minutos, 3 a 4 veces por da.  Cumpla con todas las visitas de control, segn le indique su mdico. Aqu se incluyen derivaciones a un ortopedista, fisioterapia y rehabilitacin. Gildardo Griffes en obtener la asistencia necesaria puede dar como resultado una demora o fracaso en la curacin. SOLICITE ATENCIN MDICA DE INMEDIATO SI:  Desarrolla nuevos e inexplicables sntomas.  Los sntomas actuales empeoran y la medicacin no los [place]. ASEGRESE DE QUE:   Comprende estas instrucciones.  Controlar su enfermedad.  Solicitar ayuda de inmediato si no mejora o si empeora. Document Released: 08/12/2005 Document Revised: 05/06/2012 [facility] Patient Information 2014 [place], [place].

## 2013-07-01 ENCOUNTER — Ambulatory Visit: Payer: PRIVATE HEALTH INSURANCE

## 2013-07-15 ENCOUNTER — Ambulatory Visit: Payer: PRIVATE HEALTH INSURANCE

## 2013-12-09 ENCOUNTER — Ambulatory Visit (INDEPENDENT_AMBULATORY_CARE_PROVIDER_SITE_OTHER): Payer: BC Managed Care – PPO | Admitting: Family Medicine

## 2013-12-09 ENCOUNTER — Other Ambulatory Visit: Payer: Self-pay | Admitting: Family Medicine

## 2013-12-09 VITALS — BP 122/72 | HR 62 | Temp 98.0°F | Resp 17 | Ht 61.5 in | Wt 145.0 lb

## 2013-12-09 DIAGNOSIS — Z1231 Encounter for screening mammogram for malignant neoplasm of breast: Secondary | ICD-10-CM

## 2013-12-09 DIAGNOSIS — G5600 Carpal tunnel syndrome, unspecified upper limb: Secondary | ICD-10-CM

## 2013-12-09 DIAGNOSIS — G5603 Carpal tunnel syndrome, bilateral upper limbs: Secondary | ICD-10-CM

## 2013-12-09 DIAGNOSIS — M549 Dorsalgia, unspecified: Secondary | ICD-10-CM

## 2013-12-09 MED ORDER — DICLOFENAC SODIUM 75 MG PO TBEC: 75.0000 mg | DELAYED_RELEASE_TABLET | Freq: Two times a day (BID) | ORAL | Status: DC

## 2013-12-09 MED ORDER — CYCLOBENZAPRINE HCL 10 MG PO TABS
ORAL_TABLET | ORAL | Status: DC
Start: 2013-12-09 — End: 2014-02-15

## 2013-12-09 NOTE — Progress Notes (Signed)
Subjective: Patient is here with 2 complaints. She's been having problems with numbness in her hands at nighttime and in the mornings. It clears up and she is working through the day. She has seen a doctor elsewhere who gave some injections in arrest and had her wearing splints. She has never had carpal tunnel nerve conduction testing done. She also has been having problems with pain in her upper back. It hurts across above the scapula both sides. Mild low back pains. Generally she is healthy otherwise.  She works doing housekeeping for a hotel.  Objective: Pleasant lady in no major distress. Hands appear normal. Negative Tinel's sign. Grip is good bilaterally. No atrophy. Good pulses. She is tender across the trapezius areas of the upper back, as well as down between the scapula. Good range of motion of neck. Tender to touch.  Assessment: Muscular upper back pain Bilateral carpal tunnel syndrome  Plan: Diclofenac Nerve conduction testing

## 2013-12-09 NOTE — Patient Instructions (Signed)
Sndrome del tnel carpiano  (Carpal Tunnel Syndrome)  El tnel carpiano es un espacio estrecho ubicado en el lado palmar de la [place]. Est formado por los huesos de la [place] y los ligamentos. Los nervios, vasos sanguneos y tendones pasan a travs del tnel carpiano. Los movimientos de la [place] o ciertas enfermedades pueden causar hinchazn del tnel. Esta hinchazn comprime el nervio principal en la mueca ((nervio mediano) y ocasiona un trastorno doloroso que se denomina sndrome del tnel carpiano. CAUSAS   Movimientos repetidos de [employer] mueca.  Lesiones en la [place].  Ciertas enfermedades como la artritis, la diabetes, el alcoholismo, el hipertiroidismo o la insuficiencia renal.  Obesidad.  Embarazo. SNTOMAS   Sensacin de "pinchazos" en los dedos o la mano.  Hormigueo o entumecimiento en los dedos o en la mano.  Sensacin dolorosa en todo el brazo.  Dolor en la mueca que sube por el brazo hasta el hombro.  Dolor que baja por la mano o los dedos.  Sensacin de Marathon Oildebilidad en las manos. DIAGNSTICO  El mdico le har una historia clnica y un examen fsico. Puede ser necesario hacer un electromiograma. Esta prueba mide las seales elctricas enviadas por los msculos. Generalmente el paso de las seales elctricas es impedido por el sndrome del tnel carpiano. Tambin puede ser necesario que le tomen radiografas.  TRATAMIENTO  El sndrome del tnel carpiano puede curarse espontneamente sin tratamiento. El [employer] le indicar el uso de un cabestrillo para la [place] o que tome medicamentos como antiinflamatorios no esteroides. Las inyecciones de cortisona pueden ayudar. A veces es necesaria la ciruga para liberar el nervio comprimido.  INSTRUCCIONES PARA EL CUIDADO EN EL HOGAR   Tome todos los medicamentos segn le indic su mdico. Slo tome medicamentos de venta libre o recetados para Primary school teachercalmar el dolor, las molestias o bajar la fiebre segn las indicaciones de su mdico.  Si  le aconsejaron usar un cabestrillo para evitar que la [place] se doble, selo como le indicaron. Es importante que use el cabestrillo durante la noche. selo mientras sienta dolor o adormecimiento en la mano, el brazo o la [place]. Esto puede durar entre 1 y 2 meses.  Haga reposar la [place] de toda actividad que le cause dolor. Si sus sntomas estn relacionados con Kathie Dikeel trabajo, deber conversar con su empleador acerca de la posibilidad de cambiar a una tarea que no requiera el uso de la [place].  Aplique hielo en la mueca despus de los perodos prolongados de [place].  Ponga el hielo en una bolsa plstica.  Colquese una toalla entre la piel y la bolsa de hielo.  Deje el hielo en el lugar durante 15 a 20 minutos, 3 a 4 veces por da.  Cumpla con todas las visitas de control, segn le indique su mdico. Aqu se incluyen derivaciones a un ortopedista, fisioterapia y rehabilitacin. Gildardo Griffesoda demora en obtener la asistencia necesaria puede dar como resultado una demora o fracaso en la curacin. SOLICITE ATENCIN MDICA DE INMEDIATO SI:   Desarrolla nuevos e inexplicables sntomas.  Los sntomas actuales empeoran y la medicacin no los [place]. ASEGRESE DE QUE:   Comprende estas instrucciones.  Controlar su enfermedad.  Solicitar ayuda de inmediato si no mejora o si empeora. Document Released: 08/12/2005 Document Revised: 05/06/2012 [facility] Patient Information 2014 [place], [place].     Take the diclofenac one twice daily for anti-inflammatory medication. This should help the upper back and hands also.  Take the muscle relaxant at bedtime, cyclobenzaprine  You will be contacted about  in your nerve conduction test is scheduled for. Depending on the results of this we may need to refer you to a specialist for consideration of surgery for carpal tunnel syndrome. Keep wearing the splints on your wrist at nighttime.

## 2013-12-17 ENCOUNTER — Ambulatory Visit (HOSPITAL_COMMUNITY)
Admission: RE | Admit: 2013-12-17 | Discharge: 2013-12-17 | Disposition: A | Discharge status: Home / Self Care | Payer: BC Managed Care – PPO | Source: Ambulatory Visit | Attending: Family Medicine | Admitting: Family Medicine

## 2013-12-17 DIAGNOSIS — Z1231 Encounter for screening mammogram for malignant neoplasm of breast: Secondary | ICD-10-CM

## 2014-02-15 ENCOUNTER — Ambulatory Visit (INDEPENDENT_AMBULATORY_CARE_PROVIDER_SITE_OTHER): Payer: BC Managed Care – PPO | Admitting: Physician Assistant

## 2014-02-15 VITALS — BP 120/72 | HR 66 | Temp 98.1°F | Resp 18 | Ht 60.5 in | Wt 144.0 lb

## 2014-02-15 DIAGNOSIS — R51 Headache: Secondary | ICD-10-CM

## 2014-02-15 DIAGNOSIS — Z124 Encounter for screening for malignant neoplasm of cervix: Secondary | ICD-10-CM

## 2014-02-15 DIAGNOSIS — Z23 Encounter for immunization: Secondary | ICD-10-CM

## 2014-02-15 DIAGNOSIS — Z0000 Encounter for general adult medical examination without abnormal findings: Secondary | ICD-10-CM

## 2014-02-15 DIAGNOSIS — Z1211 Encounter for screening for malignant neoplasm of colon: Secondary | ICD-10-CM

## 2014-02-15 LAB — COMPREHENSIVE METABOLIC PANEL
ALBUMIN: 4.2 g/dL (ref 3.5–5.2)
ALT: 31 U/L (ref 0–35)
AST: 26 U/L (ref 0–37)
Alkaline Phosphatase: 106 U/L (ref 39–117)
BUN: 14 mg/dL (ref 6–23)
CALCIUM: 9.2 mg/dL (ref 8.4–10.5)
CHLORIDE: 104 meq/L (ref 96–112)
CO2: 27 mEq/L (ref 19–32)
CREATININE: 0.53 mg/dL (ref 0.50–1.10)
GLUCOSE: 95 mg/dL (ref 70–99)
POTASSIUM: 4 meq/L (ref 3.5–5.3)
Sodium: 140 mEq/L (ref 135–145)
Total Bilirubin: 0.5 mg/dL (ref 0.2–1.2)
Total Protein: 7 g/dL (ref 6.0–8.3)

## 2014-02-15 LAB — POCT CBC
GRANULOCYTE PERCENT: 53.2 % (ref 37–80)
HEMATOCRIT: 41 % (ref 37.7–47.9)
Hemoglobin: 13.3 g/dL (ref 12.2–16.2)
Lymph, poc: 1.9 (ref 0.6–3.4)
MCH: 28.7 pg (ref 27–31.2)
MCHC: 32.4 g/dL (ref 31.8–35.4)
MCV: 88.6 fL (ref 80–97)
MID (CBC): 0.3 (ref 0–0.9)
MPV: 9.8 fL (ref 0–99.8)
POC Granulocyte: 2.6 (ref 2–6.9)
POC LYMPH %: 40.2 % (ref 10–50)
POC MID %: 6.6 %M (ref 0–12)
Platelet Count, POC: 340 10*3/uL (ref 142–424)
RBC: 4.63 M/uL (ref 4.04–5.48)
RDW, POC: 14.1 %
WBC: 4.8 10*3/uL (ref 4.6–10.2)

## 2014-02-15 LAB — LIPID PANEL
CHOL/HDL RATIO: 4.9 ratio
Cholesterol: 202 mg/dL — ABNORMAL HIGH (ref 0–200)
HDL: 41 mg/dL (ref >39)
LDL CALC: 123 mg/dL — AB (ref 0–99)
Triglycerides: 192 mg/dL — ABNORMAL HIGH (ref <150)
VLDL: 38 mg/dL (ref 0–40)

## 2014-02-15 MED ORDER — NAPROXEN 500 MG PO TABS: 500.0000 mg | ORAL_TABLET | Freq: Two times a day (BID) | ORAL | Status: DC

## 2014-02-15 MED ORDER — CYCLOBENZAPRINE HCL 10 MG PO TABS: 10.0000 mg | ORAL_TABLET | Freq: Three times a day (TID) | ORAL | Status: DC | PRN

## 2014-02-15 MED ORDER — KETOROLAC TROMETHAMINE 60 MG/2ML IM SOLN
60.0000 mg | Freq: Once | INTRAMUSCULAR | Status: AC
  Administered 2014-02-15: 60 mg via INTRAMUSCULAR

## 2014-02-15 NOTE — Patient Instructions (Signed)
Les hemos dado una inyeccin de medicamento para Conservation officer, historic buildings de su dolor de [place] . Se llama Toradol  Tambin he recetado un medicamento llamado naproxeno para que usted pueda tomar [employer] al da si usted tiene un dolor de cabeza  Tambin creo que la cabeza puede estar haciendo dao , porque los msculos del cuello son [place] . Les he dado Dispensing optician para tomar cada 8 horas si es necesario - tener cuidado , ya que puede causar sueo  Les hemos dado una inyeccin antitetnica hoy que ser bueno para 10 aos  Yo har saber cuando sus resultados de laboratorio estn de vuelta, incluyendo su prueba de Papanicolaou  Usted no necesita otra mamografa hasta el prximo ao  A los 48 aos , se recomienda que usted obtenga una colonoscopia de cribado para Public affairs consultant de colon. He puesto en una referencia para que usted tenga este hecho. Usted recibir AGCO Corporation telefnica NIKE programacin de esta  Asegrese de que usted est comiendo un montn de verduras , frutas, granos enteros y carnes [place]. Evite la comida rpida y los alimentos procesados ??. No tome bebidas [employer] , [place], t [place]  Trate de incorporar el ejercicio en su rutina diaria   Mantenimiento de la salud en las mujeres (Health Maintenance, Females) Un estilo de vida saludable y los cuidados preventivos pueden favorecer la salud y [place].   Haga exmenes regulares de la salud en general, dentales y de los ojos.  Consuma una dieta saludable. Los CBS Corporation, frutas, granos enteros, productos lcteos descremados y protenas magras contienen los nutrientes que usted necesita sin necesidad de consumir muchas caloras. Disminuya el consumo de alimentos con alto contenido de grasas slidas, azcar y sal agregadas. Si es necesario, pdale informacin acerca de [place] a su mdico.  La actividad fsica regular es una de las cosas ms importantes  que puede hacer por su salud. Los adultos deben hacer al menos 150 minutos de ejercicios de intensidad moderada (cualquier actividad que aumente la frecuencia cardaca y lo haga transpirar) cada semana. Adems, la State Farm de los adultos necesita ejercicios de fortalecimiento muscular 2  ms Standard Pacific.   Mantenga un peso saludable. El ndice de masa corporal [facility]) es una herramienta que identifica posibles problemas con [place]. Proporciona una estimacin de la grasa corporal basndose en el peso y la altura. El mdico podr determinar su [facility] y podr ayudarlo a Scientist, forensic o Theatre manager un peso saludable. Para los adultos de 20 aos o ms:  Un [facility] menor a 18,5 se considera bajo peso.  Un [facility] entre 18,5 y 24,9 es normal.  Un [facility] entre 25 y 29,9 es sobrepeso.  Un IMC entre 30 o ms es obesidad.  Mantenga un nivel normal de lpidos y colesterol en sangre practicando actividad fsica y minimizando la ingesta de grasas saturadas. Consuma una dieta balanceada e incluya variedad de frutas y vegetales. Los C.H. Robinson Worldwide de lpidos y Research officer, trade union en sangre deben Medical laboratory scientific officer a los 87 aos y repetirse cada 5 aos. Si los niveles de colesterol son altos, tiene ms de 50 aos o tiene riesgo elevado de sufrir enfermedades cardacas, Designer, industrial/product controlarse con ms frecuencia.Si tiene Coca Cola de lpidos y colesterol, debe recibir tratamiento con medicamentos, si la dieta y el ejercicio no son efectivos.  Si fuma, consulte con Johnson & Johnson de las opciones para dejar de [place]. Si no lo hace, no comience.  Se recomienda realizar  controles para el cncer de pulmn a los [employer]ark[employer] Petroleum Corporation de 26 a 66 aos que tengan alto riesgo de Actor la enfermedad debido a sus antecedentes de tabaquismo. Se recomienda realizar una tomografa computarizada (TC) anual de dosis bajas en aquellas personas tienen un promedio de 30 paquetes al ao de historia de tabaquismo y en la actualidad fuman o han dejado de fumar dentro  de los ltimos 15 aos. Un paquete al ao de fumador es fumar un promedio de 1 paquete de cigarrillos al da durante 1 ao (por ejemplo: 1 paquete al da durante [place] 15 aos). El control anual debe continuar hasta que el fumador haya dejado de fumar durante al menos 15 aos. Screening anual debe interrumpirse en aquellas personas que desarrollan un problema de salud que les impida seguir un tratamiento para el cncer de pulmn.  Si est embarazada no beba alcohol. Si est amamantando, beba alcohol con prudencia. Si elige beber alcohol, no se exceda de 1 medida por da. Se considera una medida a 12 onzas (355 ml) de cerveza, 5 onzas (148 ml) de vino, o 1,5 onzas (44 ml) de licor.  No comparta agujas con nadie. Pida ayuda si necesita asistencia o instrucciones con respecto a abandonar el consumo de alcohol, cigarrillos o drogas.  La hipertensin arterial causa enfermedades cardacas y [place] el riesgo de ictus. Debe controlar su presin arterial al menos cada 1 o 2 aos. La presin arterial elevada que persiste debe tratarse con medicamentos si la prdida de peso y el ejercicio no son efectivos.  Si tiene entre 77 y 76 aos, consulte a su mdico si debe tomar aspirina para prevenir enfermedades cardacas.  Los anlisis para la diabetes incluyen la toma de [place] de sangre para controlar el nivel de azcar en la sangre durante el [place]. Debe hacerlo cada 3 aos despus de los 57 aos si est dentro de su peso normal y sin factores de riesgo para la diabetes. Las pruebas deben comenzar a edades tempranas o llevarse a cabo con ms frecuencia si tiene sobrepeso y al menos 1 factor de riesgo para la diabetes.  Las evaluaciones para Public affairs consultant de mama son un mtodo preventivo fundamental para las mujeres. Debe practicar la "autoconciencia de las mamas". Esto significa que Chief Technology Officer apariencia normal de sus mamas y [place] y pudiendo incluir un  autoexamen de Glass blower/designer. Si detecta algn cambio, no importa cun pequeo sea, debe informarlo a su mdico. Las mujeres entre 20 y 73 aos deben hacer un examen clnico de las mamas como parte del examen regular de [place], cada 1 a 3 aos. Despus de los 40 aos deben Coca-Cola. Deben hacerse una mamografa radografa de mamas ) cada ao, comenzando a los 40 aos. Las mujeres con Editor, commissioning de cncer de mama deben hablar con el mdico para hacer un estudio gentico. Las que tienen ms riesgo deben Electrical engineer resonancia magntica y Fortune Brands todos [place].  La evaluacin del riesgo de sufrir cncer relacionado con el cncer de mama gentico (BRCA) se recomienda en aquellas mujeres que tienen familiares con cncer relacionados con el BRCA El cncer relacionado con BRCA incluye el cncer de mama, de ovario, de trompas y peritoneal. El hecho de tener familiares con estos tipos de cncer puede asociarse con un mayor riesgo de a sufrir cambios perjudiciales (mutaciones) en los genes para el cncer de mama BRCA1 y BRCA2. Los resultados de la evaluacin  determinar la necesidad de asesoramiento gentico BRCA1 y BRCA2 y pruebas. Los resultados de la evaluacin determinarn la necesidad de asesoramiento gentico y estudios para el BRCA1 y BRCA2.  Un Papanicolau se realiza para diagnosticar cncer de cuello de tero. Las mujeres deben hacerse un Papanicolau a Proofreader de los 21 aos. NiSource 21 y los 29 aos debe repetirse [place]. Luego de los 30 aos, debe realizarse un Papanicolau cada tres aos siempre que los 3 estudios anteriores sean normales. Si le han realizado una histerectoma por un problema que no era cncer u otra enfermedad que podra causar cncer, ya no necesitar un Papanicolau. Si tiene entre 26 y 69 aos y ha tenido Engineer, production normal en los ltimos 10 aos, ya no ser IT sales professional. Si ha recibido un tratamiento para Science writer cervical o para una enfermedad que podra  causar cncer, Dietitian un Papanicolau y controles durante al menos 75 aos de concluir el [place]. Si no se ha Futures trader con regularidad, Chief Executive Officer a evaluarse los factores de riesgo (como el tener un nuevo compaero sexual) para Programmer, applications a Actuary. Algunas mujeres sufren problemas mdicos que aumentan la probabilidad de Optician, dispensing cervical. En estos casos, el mdico podr indicar que se realice el Papanicolau con ms frecuencia.  La prueba del virus del Engineer, technical sales (VPH) es un anlisis adicional que puede usarse para Film/video editor de cuello de tero. Esta prueba busca la presencia del virus que causa los cambios en el cuello. Las Aon Corporation se recolectan durante el Papanicolau pueden usarse para el VPH. La prueba para el VPH puede usarse para Chief of Staff a mujeres de ms de 15 aos y debe usarse en mujeres de cualquier edad ONEOK del Papanicolau no sean claros. Despus de los 30 aos, las mujeres deben hacerse el anlisis para el VPH con la misma frecuencia que el Papanicolau.  El cncer colorectal puede detectarse y con frecuencia puede prevenirse. La mayor parte de los estudios de rutina comienzan a los 41 aos y [employer] States Steel Corporation 18 aos. Sin embargo, el mdico podr aconsejarle que lo haga antes, si tiene factores de riesgo para el cncer de colon. Una vez por ao, el profesional le dar un kit de prueba para Hydrologist en la materia fecal. La utilizacin de un tubo con una pequea cmara en su extremo para examinar directamente el colon (sigmoidoscopa o colonoscopa), puede detectar formas temprana de cncer colorectal. Hable con su mdico si tiene 44 aos, cuando comience con los estudios de [place]. El examen directo del colon debe repetirse cada 5 a 10 aos, hasta los 75 aos, excepto que se encuentren formas tempranas de plipos precancerosos o pequeos bultos.  Se recomienda realizar un anlisis de sangre para  Hydrographic surveyor hepatitis C a todas las personas nacidas entre 1945 y 1965, y a todo aquel que tenga un riesgo conocido de haber contrado esta enfermedad.  Practique el sexo seguro. Use condones y evite las prcticas sexuales riesgosas para disminuir el contagio de enfermedades de transmisin sexual. Las mujeres sexualmente activas de 25 aos o menos deben controlarse para descartar clamidia, que es una infeccin de transmisin sexual frecuente. Las Cendant Corporation que tengan mltiples compaeros tambin deben hacerse el anlisis para Hydrographic surveyor clamidia. Se recomienda realizar anlisis para detectar otras enfermedades de transmisin sexual si es sexualmente [place] y tiene riesgos.  La osteoporosis es una enfermedad en la que los huesos pierden los minerales y la fuerza por  el avance de la edad. El resultado pueden ser fracturas graves en los [place]. El riesgo de osteoporosis puede identificarse con Ardelia Mems prueba de densidad sea. Las mujeres de ms de 41 aos y las que tengan riesgos de sufrir fracturas u osteoporosis deben pedir consejo a su mdico. Consulte a su mdico si debe tomar un suplemento de calcio o de vitamina D para reducir el riesgo de osteoporosis.  La menopausia se asocia a sntomas y riesgos fsicos. Se dispone de una terapia de reemplazo hormonal para disminuir los sntomas y [place]. Consulte a su mdico para saber si la terapia de reemplazo hormonal es conveniente para usted.  Use una pantalla solar. Aplique pantalla de Kerry Dory y repetida a lo largo del Training and development officer. Pngase al resguardo del sol cuando la sombra sea ms pequea que usted. Protjase usando mangas y [employer] Company, un sombrero de ala ancha y gafas para el sol todo el ao, siempre que se encuentre en el exterior.  Informe a su mdico si aparecen nuevos lunares o los que tiene se modifican, especialmente en forma y color. Tambin notifique al mdico si un lunar es ms grande que el tamao de una goma de Games developer.  [place]. Document Released: 08/01/2011 Document Revised: 12/07/2012 [facility] Patient Information 2015 [place], [place]. This information is not intended to replace advice given to you by your health care Anoop Hemmer. Make sure you discuss any questions you have with your health care Yohance Hathorne.

## 2014-02-15 NOTE — Progress Notes (Signed)
Subjective:    Patient ID: Sarah Peck, female    DOB: 01/12/1961, 53 y.o.   MRN: [id number]  HPI   Ms. Sarah Peck is a very pleasant 53 yr old female here for CPE.  There is a language barrier and clerical staff member Charisse KlinefelterLily Medina helped to interpret for part of the visit.  Complaints:  Headache - pt reports woke up with HA at 3am, she took two advil and had some improvement; rates HA at 2-3/10 on pain scale but states HA was initially worse; pain is at both temples and both sides of her neck; she does have headaches frequently; she denies visual disturbance or diplopia; she endorses mild dizziness; she denies fever or chills; she has been in her usual state of health  LMP:  Post-menopausal GYN:  Last pap 2012 - normal; normal mammo April 2015 Dentist:  none Eye doctor:  Reading glasses Imm:  Last tetanus 2005 - due today Diet:  varied Exercise:  none Meds:  none Tobacco:  none Etoh:  Occasional use  Work:  Stage managerHousekeeping - Engineer, 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  Respiratory: Negative for cough, shortness of breath and wheezing.   Cardiovascular: Negative for chest pain.  Gastrointestinal: Negative for nausea, vomiting and abdominal pain.  Musculoskeletal: Positive for arthralgias (left knee pain).  Skin: Negative.   Neurological: Positive for headaches.       Objective:   Physical Exam  Vitals reviewed. Constitutional: She is oriented to person, place, and time. She appears well-developed and well-nourished. No distress.  HENT:  Head: Normocephalic and atraumatic.  Right Ear: Tympanic membrane and ear canal normal.  Left Ear: Tympanic membrane and ear canal normal.  Mouth/Throat: Uvula is midline, oropharynx is clear and moist and mucous membranes are normal.  Eyes: Conjunctivae, EOM and lids are normal. Pupils are equal, round, and reactive to light.  Neck: Normal range of motion. Neck supple. Carotid bruit is not present.     Tenderness/spasm at cervical paraspinals  Cardiovascular: Normal rate, regular rhythm, normal heart sounds and intact distal pulses.   No murmur heard. Pulmonary/Chest: Effort normal and breath sounds normal. She has no wheezes. She has no rales.  Abdominal: Soft. Bowel sounds are normal. There is no tenderness.  Genitourinary: There is no rash, tenderness or lesion on the right labia. There is no rash, tenderness or lesion on the left labia. Cervix exhibits no motion tenderness and no discharge. Right adnexum displays no mass, no tenderness and no fullness. Left adnexum displays no mass, no tenderness and no fullness. No vaginal discharge found.  Neurological: She is alert and oriented to person, place, and time. She has normal reflexes. No cranial nerve deficit. Coordination normal.  Skin: Skin is warm and dry.  Psychiatric: She has a normal mood and affect. Her behavior is normal.    Results for orders placed in visit on 02/15/14  POCT CBC      Result Value Ref Range   WBC 4.8  4.6 - 10.2 K/uL   Lymph, poc 1.9  0.6 - 3.4   POC LYMPH PERCENT 40.2  10 - 50 %L   MID (cbc) 0.3  0 - 0.9   POC MID % 6.6  0 - 12 %M   POC Granulocyte 2.6  2 - 6.9   Granulocyte percent 53.2  37 - 80 %G   RBC 4.63  4.04 - 5.48 M/uL   Hemoglobin 13.3  12.2 - 16.2  g/dL   HCT, POC 95.641.0  21.337.7 - 47.9 %   MCV 88.6  80 - 97 fL   MCH, POC 28.7  27 - 31.2 pg   MCHC 32.4  31.8 - 35.4 g/dL   RDW, POC 08.614.1     Platelet Count, POC 340  142 - 424 K/uL   MPV 9.8  0 - 99.8 fL    Headache improved after toradol injection    Assessment & Plan:  Routine general medical examination at a health care facility - Plan: POCT CBC, Comprehensive metabolic panel, Lipid panel, TSH, Tdap vaccine greater than or equal to 7yo IM, Pap IG, CT/NG w/ reflex HPV when ASC-U, CANCELED: POCT urinalysis dipstick, CANCELED: Pap IG, CT/NG w/ reflex HPV when ASC-U  Screening for cervical cancer - Plan: Pap IG, CT/NG w/ reflex HPV when  ASC-U, CANCELED: Pap IG, CT/NG w/ reflex HPV when ASC-U  Headache(784.0) - Plan: ketorolac (TORADOL) injection 60 mg, Pap IG, CT/NG w/ reflex HPV when ASC-U, naproxen (NAPROSYN) 500 MG tablet, cyclobenzaprine (FLEXERIL) 10 MG tablet  Need for Tdap vaccination - Plan: Tdap vaccine greater than or equal to 7yo IM, Pap IG, CT/NG w/ reflex HPV when ASC-U  Special screening for malignant neoplasms, colon - Plan: Ambulatory referral to Gastroenterology   Ms. Sarah Peck is a very pleasant 53 yr old female here for CPE.  She appears to be in good health and her exam is normal.  CBC is wnl.  CMP, lipid panel, TSH pending.  Pt unable to give urine sample.  Pap collected.  Her mammogram was normal in April 2015.  Tetanus updated today.  Referred for screening colonoscopy as none is documented in her chart.  Pt complains of HA that started at 3am - unclear if HA actually woke her from sleep.  She had improvement with advil at the onset of HA.  Tordol IM given in clinic with complete resolution of HA.  She does have tenderness and palpable spasm of cervical paraspinal muscles, which is likely contributing to HA.  Will try naproxen and flexeril prn.    Pt to call or RTC if worsening or not improving  E. Frances FurbishElizabeth Egan MHS, PA-C [facility] 6/23/20156:18 PM

## 2014-02-16 LAB — PAP IG, CT-NG, RFX HPV ASCU
Chlamydia Probe Amp: NEGATIVE
GC Probe Amp: NEGATIVE

## 2014-02-16 LAB — TSH: TSH: 2.235 u[IU]/mL (ref 0.350–4.500)

## 2014-03-22 ENCOUNTER — Encounter: Payer: Self-pay | Admitting: Physician Assistant

## 2014-10-28 ENCOUNTER — Ambulatory Visit (INDEPENDENT_AMBULATORY_CARE_PROVIDER_SITE_OTHER): Payer: 59 | Admitting: Family Medicine

## 2014-10-28 VITALS — BP 120/80 | HR 63 | Temp 97.9°F | Resp 16 | Ht 61.5 in | Wt 148.0 lb

## 2014-10-28 DIAGNOSIS — G5603 Carpal tunnel syndrome, bilateral upper limbs: Secondary | ICD-10-CM

## 2014-10-28 DIAGNOSIS — G5602 Carpal tunnel syndrome, left upper limb: Secondary | ICD-10-CM | POA: Diagnosis not present

## 2014-10-28 DIAGNOSIS — Z789 Other specified health status: Secondary | ICD-10-CM

## 2014-10-28 DIAGNOSIS — Z603 Acculturation difficulty: Secondary | ICD-10-CM

## 2014-10-28 DIAGNOSIS — Z758 Other problems related to medical facilities and other health care: Secondary | ICD-10-CM | POA: Insufficient documentation

## 2014-10-28 DIAGNOSIS — G5601 Carpal tunnel syndrome, right upper limb: Secondary | ICD-10-CM

## 2014-10-28 MED ORDER — MELOXICAM 15 MG PO TABS: 15.0000 mg | ORAL_TABLET | Freq: Every day | ORAL | Status: DC

## 2014-10-28 NOTE — Patient Instructions (Addendum)
Sndrome del tnel carpiano  (Carpal Tunnel Syndrome)  El tnel carpiano es un rea que se encuentra debajo la piel de la palma de su Blodgett Millsmano. A travs del tnel pasan nervios, vasos sanguneos y tejidos resistentes (tendones). El tnel puede inflamarse (hincharse). Si esto ocurre, un nervio puede quedar pellizcado en la [place]. Esto es lo que causa el sndrome del tnel carpiano.  CUIDADOS EN EL HOGAR   Tome los medicamentos como le indic el mdico.  Si le indicaron un cabestrillo, selo segn las indicaciones. selos a la noche o cuando su mdico le indique.  Haga descansar la Time Warn[place]ueca de la actividad que le Sports administratorproduce dolor.  Ponga hielo en la mueca despus de largos perodos de [place] and the Grenadinesactividad de la misma.  Ponga el hielo en una bolsa plstica.  Colquese una toalla entre la piel y la bolsa de hielo.  Deje el hielo durante 15 a 20 minutos, 3 a 4 veces por da.  Cumpla con los controles mdicos segn las indicaciones. SOLICITE AYUDA DE INMEDIATO SI:   Tiene nuevos sntomas que no puede explicar.  Siente un dolor ms intenso y los medicamentos no Winn-Dixiehacen efecto. ASEGRESE DE QUE:   Comprende estas instrucciones.  Controlar su enfermedad.  Solicitar ayuda de inmediato si no mejora o si empeora. Document Released: 08/01/2011 Document Revised: 11/04/2011 [facility] Patient Information 2015 [place], [place]. This information is not intended to replace advice given to you by your health care provider. Make sure you discuss any questions you have with your health care provider.   I will refer you to a specialist to help with your hand pain.  Use the mobic once a day as needed for the pain in your hands. Also continue to use your wrist braces.

## 2014-10-28 NOTE — Progress Notes (Signed)
[facility] [address], [place] [id number] [phone]  Date:  10/28/2014   Name:  Sarah Peck   DOB:  06/27/1961   MRN:  [id number]  PCP:  Uvaldo RisingFLETKE, KYLE, J, MD    Chief Complaint: No chief complaint on file.   History of Present Illness:  Sarah PhyMaria A Aydin is a 54 y.o. very pleasant female patient who presents with the following:  She is here today with pain and numbness in both hands.  This has gone on for about a couple of years- several other visits for CTS noted in Epic.  It seems to be getting worse.  It seems to be equal in both hands and the same in all parts of her hand.  She has tried some tylenol so far, no other medications.  She is also wearing bilateral wrist braces some of the time- these do not seem to help.  She works in housekeeping.  Her sx seem to be worse at night.  "I don't sleep."   Patient Active Problem List   Diagnosis Date Noted  . Muscle spasm of back 06/22/2013  . Left knee pain 06/22/2013  . Allergic rhinitis 02/22/2013  . Globus pharyngeus 02/22/2013  . Depression 10/28/2011  . Insomnia 10/14/2011  . Trigger middle finger of right hand 10/14/2011  . Headache, classical migraine 10/14/2011  . GERD (gastroesophageal reflux disease) 11/05/2010  . MENORRHAGIA, PERIMENOPAUSAL 08/28/2010  . ARTHRALGIA 06/04/2010  . PERIMENOPAUSAL SYNDROME 08/28/2009  . Overweight 07/27/2009  . Carpal tunnel syndrome 07/27/2009  . PTERYGIUM 08/08/2008    Past Medical History  Diagnosis Date  . Allergy   . Anemia   . Arthritis     Past Surgical History  Procedure Laterality Date  . Tubal ligation  2004    History  Substance Use Topics  . Smoking status: Never Smoker   . Smokeless tobacco: Not on file  . Alcohol Use: Yes    No family history on file.  No Known Allergies  Medication list has been reviewed and updated.  Current Outpatient Prescriptions on File Prior to Visit  Medication Sig Dispense Refill  .  cyclobenzaprine (FLEXERIL) 10 MG tablet Take 1 tablet (10 mg total) by mouth 3 (three) times daily as needed for muscle spasms. 30 tablet 0  . naproxen (NAPROSYN) 500 MG tablet Take 1 tablet (500 mg total) by mouth 2 (two) times daily with a meal. 60 tablet 1   No current facility-administered medications on file prior to visit.    Review of Systems:  As per HPI- otherwise negative.   Physical Examination: Filed Vitals:   10/28/14 1251  BP: 120/80  Pulse: 63  Temp: 97.9 F (36.6 C)  Resp: 16   Filed Vitals:   10/28/14 1251  Height: 5' 1.5" (1.562 m)  Weight: 148 lb (67.132 kg)   Body mass index is 27.51 kg/(m^2). Ideal Body Weight: Weight in (lb) to have BMI = 25: 134.2  GEN: WDWN, NAD, Non-toxic, A & O x 3, mild overweight, looks well HEENT: Atraumatic, Normocephalic. Neck supple. No masses, No LAD. Ears and Nose: No external deformity. CV: RRR, No M/G/R. No JVD. No thrill. No extra heart sounds. PULM: CTA B, no wheezes, crackles, rhonchi. No retractions. No resp. distress. No accessory muscle use. EXTR: No c/c/e NEURO Normal gait.  PSYCH: Normally interactive. Conversant. Not depressed or anxious appearing.  Calm demeanor.  She notes increase of her sx with pressure over the carpal tunnel or with  flexion of the wrist bilaterally Normal ROM, strength of both wrists and hands    Assessment and Plan: Bilateral carpal tunnel syndrome - Plan: meloxicam (MOBIC) 15 MG tablet, Ambulatory referral to Orthopedic Surgery  It appears that she had had bilateral CTS for several years and has failed conservative treatment  Will refer to ortho for nerve conduction studies and also for surgical treatment if indicated. She is already wearing wrist braces bilaterally.  rx mobic to use as needed for pain  Signed Abbe Amsterdam, MD

## 2014-11-24 ENCOUNTER — Other Ambulatory Visit: Payer: Self-pay | Admitting: Family Medicine

## 2014-11-25 NOTE — Telephone Encounter (Signed)
Dr Patsy Lageropland, do you want to give pt RFs?

## 2014-12-08 ENCOUNTER — Other Ambulatory Visit: Payer: Self-pay | Admitting: Family Medicine

## 2014-12-08 DIAGNOSIS — Z1231 Encounter for screening mammogram for malignant neoplasm of breast: Secondary | ICD-10-CM

## 2014-12-26 ENCOUNTER — Ambulatory Visit (HOSPITAL_COMMUNITY)
Admission: RE | Admit: 2014-12-26 | Discharge: 2014-12-26 | Disposition: A | Discharge status: Home / Self Care | Payer: 59 | Source: Ambulatory Visit | Attending: Family Medicine | Admitting: Family Medicine

## 2014-12-26 DIAGNOSIS — Z1231 Encounter for screening mammogram for malignant neoplasm of breast: Secondary | ICD-10-CM | POA: Diagnosis present

## 2015-01-12 ENCOUNTER — Encounter: Payer: Self-pay | Admitting: Family Medicine

## 2015-06-22 ENCOUNTER — Ambulatory Visit (INDEPENDENT_AMBULATORY_CARE_PROVIDER_SITE_OTHER): Payer: 59 | Admitting: Family Medicine

## 2015-06-22 ENCOUNTER — Encounter: Payer: Self-pay | Admitting: Family Medicine

## 2015-06-22 VITALS — BP 125/64 | HR 66 | Temp 98.3°F | Ht 62.0 in | Wt 152.1 lb

## 2015-06-22 DIAGNOSIS — Z0000 Encounter for general adult medical examination without abnormal findings: Secondary | ICD-10-CM | POA: Insufficient documentation

## 2015-06-22 DIAGNOSIS — B349 Viral infection, unspecified: Secondary | ICD-10-CM | POA: Diagnosis not present

## 2015-06-22 DIAGNOSIS — Z23 Encounter for immunization: Secondary | ICD-10-CM | POA: Diagnosis not present

## 2015-06-22 LAB — COMPREHENSIVE METABOLIC PANEL
ALBUMIN: 4.3 g/dL (ref 3.6–5.1)
ALK PHOS: 124 U/L (ref 33–130)
ALT: 38 U/L — AB (ref 6–29)
AST: 31 U/L (ref 10–35)
BUN: 16 mg/dL (ref 7–25)
CHLORIDE: 107 mmol/L (ref 98–110)
CO2: 26 mmol/L (ref 20–31)
Calcium: 9.2 mg/dL (ref 8.6–10.4)
Creat: 0.62 mg/dL (ref 0.50–1.05)
Glucose, Bld: 105 mg/dL — ABNORMAL HIGH (ref 65–99)
POTASSIUM: 4.4 mmol/L (ref 3.5–5.3)
Sodium: 140 mmol/L (ref 135–146)
TOTAL PROTEIN: 7.3 g/dL (ref 6.1–8.1)
Total Bilirubin: 0.5 mg/dL (ref 0.2–1.2)

## 2015-06-22 LAB — TSH: TSH: 2.54 u[IU]/mL (ref 0.350–4.500)

## 2015-06-22 LAB — CBC
HEMATOCRIT: 40.3 % (ref 36.0–46.0)
Hemoglobin: 13.5 g/dL (ref 12.0–15.0)
MCH: 28.4 pg (ref 26.0–34.0)
MCHC: 33.5 g/dL (ref 30.0–36.0)
MCV: 84.7 fL (ref 78.0–100.0)
MPV: 10.1 fL (ref 8.6–12.4)
Platelets: 309 10*3/uL (ref 150–400)
RBC: 4.76 MIL/uL (ref 3.87–5.11)
RDW: 13.5 % (ref 11.5–15.5)
WBC: 4.7 10*3/uL (ref 4.0–10.5)

## 2015-06-22 LAB — LIPID PANEL
CHOLESTEROL: 194 mg/dL (ref 125–200)
HDL: 39 mg/dL — ABNORMAL LOW (ref >=46)
LDL Cholesterol: 117 mg/dL (ref <130)
TRIGLYCERIDES: 190 mg/dL — AB (ref <150)
Total CHOL/HDL Ratio: 5 Ratio (ref <=5.0)
VLDL: 38 mg/dL — AB (ref <30)

## 2015-06-22 LAB — HEPATITIS C ANTIBODY: HCV Ab: NEGATIVE

## 2015-06-22 NOTE — Patient Instructions (Signed)
It was nice to see you today.  Dr. Randolm IdolFletke will call you with your lab results.   You will be given a flu shot today.  You are up to date on your mammogram and pap smear.  You have been referred to GI (stomach doctor) for a colonoscopy.

## 2015-06-22 NOTE — Assessment & Plan Note (Signed)
Routine Health Maintenance encounter -due for Flu vaccine (given today), otherwise up to date on vaccines -Due for colon cancer screening, referral to GI -Up to date on Mammogram and Pap smear -Counseled on lifestyle modifications -Routine labs and HepC

## 2015-06-22 NOTE — Progress Notes (Signed)
54 y.o. year old female presents for well woman/preventative visit and annual GYN examination.  Acute Concerns: 1 week of sore throat, congestion, mucous, no fevers/chills, mild sob at night, has not taken any otc medications  Diet: reports multiple servings of fruit/vegetables/dair per day  Exercise: no formal exercise other than at work  Sexual/Birth History: Currently sexually active with boyfriend, denies domestic abuse, LMP 2013, no associated hot flashes/vaginal issues  Social:  Social History   Social History  . Marital Status: Widowed    Spouse Name: N/A  . Number of Children: 6  . Years of Education: 2   Occupational History  .     Social History Main Topics  . Smoking status: Never Smoker   . Smokeless tobacco: None  . Alcohol Use: Yes  . Drug Use: No  . Sexual Activity: Yes    Birth Control/ Protection: Other-see comments   Other Topics Concern  . None   Social History Narrative   Separated many years from alcoholic husband   [place], 4 children here   Native of [place], [place]   Parents, 2 sons ages 2224 and 5025 and 5 grandchildren still live there   She sends money back to them    Immunization:  Tdap/TD: 01/2014  Influenza: Due  Pneumococcal: Not a candidate  Herpes Zoster: Not a dandidate  Cancer Screening:  Pap Smear: 01/2014 (no vaginal bleeding/irritation)  Mammogram: 12/2014 (no breast lumps or nipple discharge)  Colonoscopy: Due, no changes in stools  Dexa: Not a candidate  Physical Exam: VITALS: reviewed GEN: pleasant hispanic female, NAD, video interpretor present HEENT:normocephalic, PERRL, EOMI, no scleral icterus, MMM, neck supple, no anterior or posterior cervical lymphadenopathy, no thyromegaly CARDIAC: RRR, S1 and S2 present, no murmur, no heaves/thrills RESP: CTAB, normal effort BREAST:Deferred as no reported issues  ABD: soft, no tenderness, normal bowel sounds GU/GYN:Deferred as not reported issues   EXT: no edema, 2+ radial pulses, 2+ DP pulses SKIN: no rash  ASSESSMENT & PLAN: 54 y.o. female presents for annual well woman/preventative exam and GYN exam. Please see problem specific assessment and plan.    Encounter for health maintenance examination Routine Health Maintenance encounter -due for Flu vaccine (given today), otherwise up to date on vaccines -Due for colon cancer screening, referral to GI -Up to date on Mammogram and Pap smear -Counseled on lifestyle modifications -Routine labs and HepC  Viral illness Patient presents with symptoms consistent with viral illness -encouraged use of otc cough/cold medications

## 2015-06-22 NOTE — Assessment & Plan Note (Signed)
Patient presents with symptoms consistent with viral illness -encouraged use of otc cough/cold medications

## 2015-06-23 ENCOUNTER — Encounter: Payer: Self-pay | Admitting: Family Medicine

## 2015-06-23 DIAGNOSIS — R7301 Impaired fasting glucose: Secondary | ICD-10-CM | POA: Insufficient documentation

## 2015-08-03 ENCOUNTER — Other Ambulatory Visit: Payer: Self-pay | Admitting: Gastroenterology

## 2015-08-23 ENCOUNTER — Ambulatory Visit (INDEPENDENT_AMBULATORY_CARE_PROVIDER_SITE_OTHER): Payer: 59 | Admitting: Family Medicine

## 2015-08-23 VITALS — BP 120/70 | HR 77 | Temp 97.6°F | Resp 18 | Ht 61.5 in | Wt 153.0 lb

## 2015-08-23 DIAGNOSIS — R05 Cough: Secondary | ICD-10-CM | POA: Diagnosis not present

## 2015-08-23 DIAGNOSIS — J069 Acute upper respiratory infection, unspecified: Secondary | ICD-10-CM | POA: Diagnosis not present

## 2015-08-23 DIAGNOSIS — R059 Cough, unspecified: Secondary | ICD-10-CM

## 2015-08-23 MED ORDER — BENZONATATE 100 MG PO CAPS: 100.0000 mg | ORAL_CAPSULE | Freq: Three times a day (TID) | ORAL | Status: DC | PRN

## 2015-08-23 MED ORDER — HYDROCODONE-HOMATROPINE 5-1.5 MG/5ML PO SYRP: 5.0000 mL | ORAL_SOLUTION | ORAL | Status: DC | PRN

## 2015-08-23 NOTE — Progress Notes (Signed)
Patient ID: Gillermina PhyMaria A Keener, female    DOB: 09/11/1960  Age: 54 y.o. MRN: [id number]  Chief Complaint  Patient presents with  . Cough    since yesterday     Subjective:   54 year old lady who has been getting a respiratory tract infection for the last 4 days. She is now coughing, bringing up quite mucus. She does not smoke. She feels like she has had some fever. Her throat isn't sore. Ears do not hurt. The cough is bothering her during the nighttime.  She works as a Engineering geologisthotel housekeeper. She was supposed to work today. She is a nonsmoker.  Current allergies, medications, problem list, past/family and social histories reviewed.  Objective:  BP 120/70 mmHg  Pulse 77  Temp(Src) 97.6 F (36.4 C) (Oral)  Resp 18  Ht 5' 1.5" (1.562 m)  Wt 153 lb (69.4 kg)  BMI 28.44 kg/m2  SpO2 97%  LMP 08/26/2012  Pleasant lady, alert and oriented. His not speak much [place]English. Her granddaughter who is with her is very helpful. Her TMs are normal. She is not febrile. Her throat is nonerythematous. Neck supple without significant nodes. Chest clear to auscultation.  Assessment & Plan:   Assessment: No diagnosis found.    Plan: Consistent with an upper respiratory virus. We'll treat symptomatically.        Patient Instructions  Take over-the-counter Allegra-D (cetirizine D) 1 daily for the head congestion and nose drainage  Take the benzonatate cough pills one or 2 pills 3 times a day as needed for daytime cough. This will not make you sleepy  Take the cough syrup when you are not working or at bedtime 1 teaspoon every 4-6 hours as needed for cough. This is stronger than the cough pills, but does make you sleepy so you probably will want to take it when you're going to work.  Return if getting worse.  This is a cold virus that usually takes about 7-10 days to run its course, so you can expect to continue having some problems for the next few days. However I hope that the medicine makes you  feel well enough to return to work in the next couple of days.     Return if symptoms worsen or fail to improve.   HOPPER,DAVID, MD 08/23/2015

## 2015-08-23 NOTE — Patient Instructions (Addendum)
Take over-the-counter Allegra-D (cetirizine D) 1 daily for the head congestion and nose drainage  Take the benzonatate cough pills one or 2 pills 3 times a day as needed for daytime cough. This will not make you sleepy  Take the cough syrup when you are not working or at bedtime 1 teaspoon every 4-6 hours as needed for cough. This is stronger than the cough pills, but does make you sleepy so you probably will want to take it when you're going to work.  Return if getting worse.  This is a cold virus that usually takes about 7-10 days to run its course, so you can expect to continue having some problems for the next few days. However I hope that the medicine makes you feel well enough to return to work in the next couple of days.    Infeccin del tracto respiratorio superior, adultos (Upper Respiratory Infection, Adult) La mayora de las infecciones del tracto respiratorio superior son infecciones virales de las vas que llevan el aire a los pulmones. Un infeccin del tracto respiratorio superior afecta la nariz, la garganta y las vas respiratorias superiores. El tipo ms frecuente de infeccin del tracto respiratorio superior es la nasofaringitis, que habitualmente se conoce como "resfro comn". Las infecciones del tracto respiratorio superior siguen su curso y por lo general se curan solas. En la [employer], la infeccin del tracto respiratorio superior no requiere atencin [place], Biomedical engineer a veces, despus de una infeccin viral, puede surgir una infeccin bacteriana en las vas respiratorias superiores. Esto se conoce como infeccin secundaria. Las infecciones sinusales y en el odo medio son tipos frecuentes de infecciones secundarias en el tracto respiratorio superior. La neumona bacteriana tambin puede complicar un cuadro de infeccin del tracto respiratorio superior. Este tipo de infeccin puede empeorar el asma y la enfermedad pulmonar obstructiva crnica (EPOC). En algunos casos,  estas complicaciones pueden requerir atencin mdica de emergencia y poner en peligro la vida.  CAUSAS Casi todas las infecciones del tracto respiratorio superior se deben a los virus. Un virus es un tipo de microbio que puede contagiarse de Neomia Dear persona a Educational psychologist.  FACTORES DE RIESGO Puede estar en riesgo de sufrir una infeccin del tracto respiratorio superior si:   Fuma.  Tiene una enfermedad pulmonar o cardaca crnica.  Tiene debilitado el sistema de defensa (inmunitario) del cuerpo.  Es [address] o de edad muy [place].  Tiene asma o alergias nasales.  Trabaja en reas donde hay mucha gente o poca ventilacin.  Rudi Coco en una escuela o en un centro de atencin mdica. SIGNOS Y SNTOMAS  Habitualmente, los sntomas aparecen de 2a 3das despus de entrar en contacto con el virus del resfro. La mayora de las infecciones virales en el tracto respiratorio superior duran de 7a 10das. Sin embargo, las infecciones virales en el tracto respiratorio superior a causa del virus de la gripe pueden durar de 14a 18das y, habitualmente, son ms graves. Entre los sntomas se pueden incluir los siguientes:   Secrecin o congestin nasal.  Estornudos.  Tos.  Dolor de Advertising copywriter.  Dolor de [place].  Fatiga.  Grant Ruts.  Prdida del apetito.  Dolor en la frente, detrs de los ojos y por encima de los pmulos (dolor sinusal).  Dolores musculares. DIAGNSTICO  El mdico puede diagnosticar una infeccin del tracto respiratorio superior mediante los siguientes estudios:  Examen fsico.  Pruebas para verificar si los sntomas no se deben a otra afeccin, por ejemplo:  Faringitis estreptoccica.  Sinusitis.  Neumona.  Asma. TRATAMIENTO  Esta infeccin desaparece sola, con el tiempo. No puede curarse con medicamentos, pero a menudo se prescriben para aliviar los sntomas. Los medicamentos pueden ser tiles para lo siguiente:  Personal assistantBajar la fiebre.  Reducir la tos.  Aliviar la  congestin nasal. INSTRUCCIONES PARA EL CUIDADO EN EL HOGAR   Tome los medicamentos solamente como se lo haya indicado el mdico.  A fin de Engineer, materialsaliviar el dolor de garganta, haga grgaras con solucin salina templada o consuma caramelos para la tos, como se lo haya indicado el mdico.  Use un humidificador de vapor clido o inhale el vapor de la ducha para aumentar la humedad del aire. Esto facilitar la respiracin.  Beba suficiente lquido para Photographermantener la orina clara o de color amarillo plido.  Consuma sopas y otros caldos transparentes, y Abbott Laboratoriesalimntese bien.  Descanse todo lo que sea necesario.  Regrese al Aleen Campitrabajo cuando la temperatura se le haya normalizado o cuando el mdico lo autorice. Es posible que deba quedarse en su casa durante un tiempo prolongado, para no infectar a los dems. Tambin puede usar un barbijo y lavarse las manos con cuidado para Transport plannerevitar la propagacin del virus.  Aumente el uso del inhalador si tiene asma.  No consuma ningn producto que contenga tabaco, lo que incluye cigarrillos, tabaco de Theatre managermascar o Administrator, Civil Servicecigarrillos electrnicos. Si necesita ayuda para dejar de fumar, consulte al American Expressmdico. PREVENCIN  La mejor manera de protegerse de un resfro es mantener una higiene [place]adecuada.   Evite el contacto oral o fsico con personas que tengan sntomas de resfro.  En caso de contacto, lvese las manos con frecuencia. No hay pruebas claras de que la vitaminaC, la vitaminaE, la equincea o el ejercicio reduzcan la probabilidad de Primary school teachercontraer un resfro. Sin embargo, siempre se recomienda Insurance account managerdescansar mucho, hacer ejercicio y Engineering geologistalimentarse bien.  SOLICITE ATENCIN MDICA SI:   Su estado empeora en lugar de mejorar.  Los medicamentos no Estate agentlogran controlar los sntomas.  Tiene escalofros.  La sensacin de falta de aire empeora.  Tiene mucosidad marrn o roja.  Tiene secrecin nasal amarilla o marrn.  Le duele la cara, especialmente al inclinarse hacia adelante.  Tiene  fiebre.  Tiene los ganglios del cuello hinchados.  Siente dolor al tragar.  Tiene zonas blancas en la parte de atrs de la garganta. SOLICITE ATENCIN MDICA DE INMEDIATO SI:   Tiene sntomas intensos o persistentes de:  Dolor de Turk[place]istancabeza.  Dolor de odos.  Dolor sinusal.  Dolor en el pecho.  Tiene enfermedad pulmonar crnica y cualquiera de estos sntomas:  Sibilancias.  Tos prolongada.  Tos con sangre.  Cambio en la mucosidad habitual.  Presenta rigidez en el cuello.  Tiene cambios en:  La visin.  La audicin.  El pensamiento.  El Manningtonestado de nimo. ASEGRESE DE QUE:   Comprende estas instrucciones.  Controlar su afeccin.  Recibir ayuda de inmediato si no mejora o si empeora.   Esta informacin no tiene Theme park managercomo fin reemplazar el consejo del mdico. Asegrese de hacerle al mdico cualquier pregunta que tenga.   Document Released: 05/22/2005 Document Revised: 12/27/2014 Elsevier Interactive Patient Education [employer]2016 Elsevier Inc.

## 2015-12-04 ENCOUNTER — Other Ambulatory Visit: Payer: Self-pay

## 2015-12-04 DIAGNOSIS — Z1231 Encounter for screening mammogram for malignant neoplasm of breast: Secondary | ICD-10-CM

## 2015-12-28 ENCOUNTER — Ambulatory Visit
Admission: RE | Admit: 2015-12-28 | Discharge: 2015-12-28 | Disposition: A | Discharge status: Home / Self Care | Payer: BLUE CROSS/BLUE SHIELD | Source: Ambulatory Visit

## 2015-12-28 DIAGNOSIS — Z1231 Encounter for screening mammogram for malignant neoplasm of breast: Secondary | ICD-10-CM

## 2016-06-21 ENCOUNTER — Ambulatory Visit (INDEPENDENT_AMBULATORY_CARE_PROVIDER_SITE_OTHER): Payer: BLUE CROSS/BLUE SHIELD | Admitting: Physician Assistant

## 2016-06-21 VITALS — BP 122/78 | HR 67 | Temp 97.7°F | Resp 16 | Ht 61.5 in | Wt 146.6 lb

## 2016-06-21 DIAGNOSIS — M62838 Other muscle spasm: Secondary | ICD-10-CM

## 2016-06-21 DIAGNOSIS — Z13220 Encounter for screening for lipoid disorders: Secondary | ICD-10-CM | POA: Diagnosis not present

## 2016-06-21 DIAGNOSIS — Z0000 Encounter for general adult medical examination without abnormal findings: Secondary | ICD-10-CM | POA: Diagnosis not present

## 2016-06-21 DIAGNOSIS — Z130 Encounter for screening for diseases of the blood and blood-forming organs and certain disorders involving the immune mechanism: Secondary | ICD-10-CM | POA: Diagnosis not present

## 2016-06-21 DIAGNOSIS — Z13228 Encounter for screening for other metabolic disorders: Secondary | ICD-10-CM

## 2016-06-21 DIAGNOSIS — Z23 Encounter for immunization: Secondary | ICD-10-CM

## 2016-06-21 DIAGNOSIS — R5383 Other fatigue: Secondary | ICD-10-CM | POA: Diagnosis not present

## 2016-06-21 LAB — CBC WITH DIFFERENTIAL/PLATELET
BASOS ABS: 44 {cells}/uL (ref 0–200)
BASOS PCT: 1 %
EOS ABS: 44 {cells}/uL (ref 15–500)
Eosinophils Relative: 1 %
HCT: 42.5 % (ref 35.0–45.0)
Hemoglobin: 14.4 g/dL (ref 11.7–15.5)
LYMPHS PCT: 38 %
Lymphs Abs: 1672 cells/uL (ref 850–3900)
MCH: 29.3 pg (ref 27.0–33.0)
MCHC: 33.9 g/dL (ref 32.0–36.0)
MCV: 86.4 fL (ref 80.0–100.0)
MPV: 10.3 fL (ref 7.5–12.5)
Monocytes Absolute: 264 cells/uL (ref 200–950)
Monocytes Relative: 6 %
Neutro Abs: 2376 cells/uL (ref 1500–7800)
Neutrophils Relative %: 54 %
PLATELETS: 307 10*3/uL (ref 140–400)
RBC: 4.92 MIL/uL (ref 3.80–5.10)
RDW: 14 % (ref 11.0–15.0)
WBC: 4.4 10*3/uL (ref 3.8–10.8)

## 2016-06-21 LAB — LIPID PANEL
Cholesterol: 211 mg/dL — ABNORMAL HIGH (ref 125–200)
HDL: 41 mg/dL — AB (ref >=46)
LDL CALC: 133 mg/dL — AB (ref <130)
TRIGLYCERIDES: 186 mg/dL — AB (ref <150)
Total CHOL/HDL Ratio: 5.1 Ratio — ABNORMAL HIGH (ref <=5.0)
VLDL: 37 mg/dL — AB (ref <30)

## 2016-06-21 LAB — COMPLETE METABOLIC PANEL WITH GFR
ALT: 33 U/L — ABNORMAL HIGH (ref 6–29)
AST: 33 U/L (ref 10–35)
Albumin: 4.4 g/dL (ref 3.6–5.1)
Alkaline Phosphatase: 96 U/L (ref 33–130)
BUN: 9 mg/dL (ref 7–25)
CHLORIDE: 105 mmol/L (ref 98–110)
CO2: 25 mmol/L (ref 20–31)
Calcium: 9.6 mg/dL (ref 8.6–10.4)
Creat: 0.58 mg/dL (ref 0.50–1.05)
Glucose, Bld: 101 mg/dL — ABNORMAL HIGH (ref 65–99)
POTASSIUM: 4.2 mmol/L (ref 3.5–5.3)
Sodium: 141 mmol/L (ref 135–146)
Total Bilirubin: 0.6 mg/dL (ref 0.2–1.2)
Total Protein: 7.7 g/dL (ref 6.1–8.1)

## 2016-06-21 LAB — TSH: TSH: 2.42 mIU/L

## 2016-06-21 MED ORDER — CYCLOBENZAPRINE HCL 5 MG PO TABS: 5.0000 mg | ORAL_TABLET | Freq: Three times a day (TID) | ORAL | 0 refills | Status: DC | PRN

## 2016-06-21 NOTE — Patient Instructions (Addendum)
  Use flexeril as needed for muscle spasms in the back. You can also use a heating pad to the affected areas. A massage would be good for you as well!  Keep up the healthy lifestyle and we will see you back in a year.   Thank you for letting me participate in your health and well being.    Use flexeril segn sea necesario para espasmos musculares en la espalda. Tambin puede usar una almohadilla trmica para las reas [place]afectadas. Un masaje tambin sera bueno para ti!  Mantn un estilo de vida saludable y te veremos en un ao.  Gracias por permitirme participar en tu salud y Health visitorbienestar.  IF you received an x-ray today, you will receive an invoice from [facility]. Please contact [facility] at [phone] with questions or concerns regarding your invoice.   IF you received labwork today, you will receive an invoice from [facility]. Please contact [facility] at [phone] with questions or concerns regarding your invoice.   Our billing staff will not be able to assist you with questions regarding bills from these companies.  You will be contacted with the lab results as soon as they are available. The fastest way to get your results is to activate your My Chart account. Instructions are located on the last page of this paperwork. If you have not heard from us regarding the results in 2 weeks, please contact this office.

## 2016-06-21 NOTE — Progress Notes (Signed)
Sarah Peck  MRN: [id number] DOB: 02/13/1961  Subjective:  Pt is a 55 y.o. female who presents for annual physical exam.   Diet: She eats everything. She will eat coffee and bread for breakfast, soups/beans/rice for lunch or dinner. She drinks water mostly. No sweet tea. Will have occasional soda for lunch.  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  Social: She lives at home with her son and boyfriend. She is not sexually active.  Cycles: Pt is postmenopausal. Her last period was in 2013.   Sleep: She gets 5-6 hours. She still feels tired throughout the day.  Last annual physical: 2016 Last dental exam: She has dentures therefore has not had a dental exam in a while Last vision exam: Years ago Last pap smear: 02/15/2014, normal  Last mammogram: 12/28/2015 Last colonoscopy: 2917, has to go back in 5 years Vaccinations      Tetanus: 02/15/14   Patient Active Problem List   Diagnosis Date Noted  . Elevated fasting glucose 06/23/2015  . Encounter for health maintenance examination 06/22/2015  . Viral illness 06/22/2015  . Language barrier 10/28/2014  . Muscle spasm of back 06/22/2013  . Left knee pain 06/22/2013  . Allergic rhinitis 02/22/2013  . Globus pharyngeus 02/22/2013  . Depression 10/28/2011  . Insomnia 10/14/2011  . Trigger middle finger of right hand 10/14/2011  . Headache, classical migraine 10/14/2011  . GERD (gastroesophageal reflux disease) 11/05/2010  . MENORRHAGIA, PERIMENOPAUSAL 08/28/2010  . ARTHRALGIA 06/04/2010  . PERIMENOPAUSAL SYNDROME 08/28/2009  . Overweight(278.02) 07/27/2009  . Carpal tunnel syndrome 07/27/2009  . PTERYGIUM 08/08/2008    Current Outpatient Prescriptions on File Prior to Visit  Medication Sig Dispense Refill  . benzonatate (TESSALON) 100 MG capsule Take 1-2 capsules (100-200 mg total) by mouth 3 (three) times daily as needed. (Patient not  taking: Reported on 06/21/2016) 30 capsule 0  . HYDROcodone-homatropine (HYCODAN) 5-1.5 MG/5ML syrup Take 5 mLs by mouth every 4 (four) hours as needed. (Patient not taking: Reported on 06/21/2016) 120 mL 0   No current facility-administered medications on file prior to visit.     No Known Allergies  Social History   Social History  . Marital status: Widowed    Spouse name: N/A  . Number of children: 6  . Years of education: 2   Occupational History  .  [employer]   Social History Main Topics  . Smoking status: Never Smoker  . Smokeless tobacco: Never Used  . Alcohol use Yes  . Drug use: No  . Sexual activity: Yes    Birth control/ protection: Other-see comments   Other Topics Concern  . None   Social History Narrative   Separated many years from alcoholic husband   [place], 4 children here   Native of [place], [place]   Parents, 2 sons ages 1124 and 9725 and 5 grandchildren still live there   She sends money back to them    Past Surgical History:  Procedure Laterality Date  . TUBAL LIGATION  2004   History reviewed. No pertinent family history.  Review of Systems  Constitutional: Negative.   HENT: Negative.   Eyes: Negative.   Respiratory: Negative.   Cardiovascular: Negative.   Gastrointestinal: Negative.   Endocrine: Negative.   Genitourinary: Negative.   Musculoskeletal: Positive for myalgias. Negative for arthralgias, back pain, gait problem and joint swelling.  Skin: Negative.  Allergic/Immunologic: Negative.   Neurological: Negative.   Hematological: Negative.   Psychiatric/Behavioral: Negative.      Objective:  BP 122/78 (BP Location: Right Arm, Patient Position: Sitting, Cuff Size: Small)   Pulse 67   Temp 97.7 F (36.5 C) (Oral)   Resp 16   Ht 5' 1.5" (1.562 m)   Wt 146 lb 9.6 oz (66.5 kg)   LMP 08/26/2012   SpO2 97%   BMI 27.25 kg/m   Physical Exam  Constitutional: She is oriented to person, place, and  time and well-developed, well-nourished, and in no distress.  HENT:  Head: Normocephalic and atraumatic.  Right Ear: Hearing, tympanic membrane, external ear and ear canal normal.  Left Ear: Hearing, tympanic membrane, external ear and ear canal normal.  Nose: Nose normal.  Mouth/Throat: Uvula is midline, oropharynx is clear and moist and mucous membranes are normal. No oropharyngeal exudate.  Eyes: Conjunctivae, EOM and lids are normal. Pupils are equal, round, and reactive to light. No scleral icterus.  Neck: Trachea normal and normal range of motion. No thyroid mass and no thyromegaly present.  Cardiovascular: Normal rate, regular rhythm, normal heart sounds and intact distal pulses.   Pulmonary/Chest: Effort normal and breath sounds normal. Right breast exhibits no inverted nipple, no mass, no nipple discharge, no skin change and no tenderness. Left breast exhibits no inverted nipple, no mass, no nipple discharge, no skin change and no tenderness. Breasts are symmetrical.  Abdominal: Soft. Normal appearance and bowel sounds are normal. There is no tenderness.  Musculoskeletal: Normal range of motion.       Cervical back: She exhibits spasm (bilaterally).  Lymphadenopathy:       Head (right side): No tonsillar, no preauricular, no posterior auricular and no occipital adenopathy present.       Head (left side): No tonsillar, no preauricular, no posterior auricular and no occipital adenopathy present.    She has no cervical adenopathy.       Right: No supraclavicular adenopathy present.       Left: No supraclavicular adenopathy present.  Neurological: She is alert and oriented to person, place, and time. She has normal sensation, normal strength and normal reflexes. Gait normal.  Skin: Skin is warm and dry.  Psychiatric: Affect normal.    No exam data present  Assessment and Plan :  Discussed healthy lifestyle, diet, exercise, preventative care, vaccinations, and addressed patient's  concerns. Plan for follow up in one year. Otherwise, plan for specific conditions below.  1. Annual physical exam Await lab results  2. Need for prophylactic vaccination and inoculation against influenza - Flu Vaccine QUAD 36+ mos IM  3. Screening for metabolic disorder - COMPLETE METABOLIC PANEL WITH GFR  4. Screening, lipid - Lipid panel  5. Other fatigue - TSH  6. Screening, anemia, deficiency, iron - CBC with Differential/Platelet  7. Muscle spasms of neck -Pt instructed to use flexeril as needed for her neck muscle spasms, also instructed to use heating pad and get a massage as this will help.  - cyclobenzaprine (FLEXERIL) 5 MG tablet; Take 1 tablet (5 mg total) by mouth 3 (three) times daily as needed for muscle spasms.  Dispense: 60 tablet; Refill: 0   Benjiman Core PA-C  [facility] 06/21/2016 9:53 AM

## 2016-07-01 ENCOUNTER — Telehealth: Payer: Self-pay | Admitting: *Deleted

## 2017-02-10 ENCOUNTER — Other Ambulatory Visit: Payer: Self-pay | Admitting: Physician Assistant

## 2017-02-10 DIAGNOSIS — Z1231 Encounter for screening mammogram for malignant neoplasm of breast: Secondary | ICD-10-CM

## 2017-02-18 ENCOUNTER — Ambulatory Visit
Admission: RE | Admit: 2017-02-18 | Discharge: 2017-02-18 | Disposition: A | Discharge status: Home / Self Care | Payer: BLUE CROSS/BLUE SHIELD | Source: Ambulatory Visit | Attending: Physician Assistant | Admitting: Physician Assistant

## 2017-02-18 DIAGNOSIS — Z1231 Encounter for screening mammogram for malignant neoplasm of breast: Secondary | ICD-10-CM

## 2017-03-18 DIAGNOSIS — E785 Hyperlipidemia, unspecified: Secondary | ICD-10-CM | POA: Insufficient documentation

## 2018-02-20 ENCOUNTER — Other Ambulatory Visit: Payer: Self-pay | Admitting: Physician Assistant

## 2018-02-20 DIAGNOSIS — Z1231 Encounter for screening mammogram for malignant neoplasm of breast: Secondary | ICD-10-CM

## 2018-03-20 ENCOUNTER — Ambulatory Visit
Admission: RE | Admit: 2018-03-20 | Discharge: 2018-03-20 | Disposition: A | Discharge status: Home / Self Care | Payer: BLUE CROSS/BLUE SHIELD | Source: Ambulatory Visit | Attending: Physician Assistant | Admitting: Physician Assistant

## 2018-03-20 DIAGNOSIS — Z1231 Encounter for screening mammogram for malignant neoplasm of breast: Secondary | ICD-10-CM

## 2018-03-24 ENCOUNTER — Other Ambulatory Visit: Payer: Self-pay | Admitting: Physician Assistant

## 2018-03-24 DIAGNOSIS — R928 Other abnormal and inconclusive findings on diagnostic imaging of breast: Secondary | ICD-10-CM

## 2018-04-08 ENCOUNTER — Ambulatory Visit: Admission: RE | Admit: 2018-04-08 | Payer: BLUE CROSS/BLUE SHIELD | Source: Ambulatory Visit

## 2018-04-08 ENCOUNTER — Ambulatory Visit
Admission: RE | Admit: 2018-04-08 | Discharge: 2018-04-08 | Disposition: A | Discharge status: Home / Self Care | Payer: BLUE CROSS/BLUE SHIELD | Source: Ambulatory Visit | Attending: Physician Assistant | Admitting: Physician Assistant

## 2018-04-08 DIAGNOSIS — R928 Other abnormal and inconclusive findings on diagnostic imaging of breast: Secondary | ICD-10-CM

## 2019-01-30 ENCOUNTER — Other Ambulatory Visit: Payer: Self-pay | Admitting: *Deleted

## 2019-01-30 DIAGNOSIS — Z20822 Contact with and (suspected) exposure to covid-19: Secondary | ICD-10-CM

## 2019-02-01 LAB — NOVEL CORONAVIRUS, NAA: SARS-CoV-2, NAA: DETECTED — AB

## 2019-02-02 ENCOUNTER — Telehealth: Payer: Self-pay | Admitting: Critical Care Medicine

## 2019-02-02 DIAGNOSIS — U071 COVID-19: Secondary | ICD-10-CM

## 2019-02-02 HISTORY — DX: COVID-19: U07.1

## 2019-02-02 NOTE — Telephone Encounter (Signed)
I connected by phone with Sarah Peck who has a positive COVID test.  Sarah husband I had already spoken to Sarah Peck also was positive and he had received his contact at work it was positive.  Apparently when he came home his spouse received the virus and now she is infected.  She states she has been having for the past 4 days a dry cough some sneezing low-grade fever but does not have any gastrointestinal complaints.  She does have back and neck pain and muscular pain all over.  She feels very fatigued.  She is coughing some at night but is not short of breath.  Sarah grandson also was tested we do not have those results as of yet.  The patient does not have a primary care provider.  Sarah only past medical history is that of obesity.  I will plan to have this patient on my schedule at the same time I follow-up with Sarah husband and they and she has been instructed to stay in isolation and not expose others.  Also she has been instructed that she worsens significantly with Sarah breathing she should go to the emergency room and let them know she is COVID positive  I did this telephone visit to [facility] interpreters Spanish speaking interpreter Myra Gianotti we need to put this patient on at the same time as Sarah Peck

## 2019-02-02 NOTE — Telephone Encounter (Signed)
-----   Message from Kendrick [place], RN sent at 02/02/2019  8:53 AM EDT ----- Regarding: Positive result Dr. Joya Gaskins,  This patient was tested Saturday 01-30-2019.   ----- Message ----- From: Lavone Neri Lab Results In Sent: 02/01/2019   8:35 PM EDT To: Mobile Screening Testing Result Pool

## 2019-02-03 ENCOUNTER — Telehealth: Payer: Self-pay | Admitting: *Deleted

## 2019-02-03 NOTE — Telephone Encounter (Signed)
Called patient and LVM to return call and schedule an appt with Dr. Joya Gaskins for 02/08/19

## 2019-02-07 NOTE — Progress Notes (Signed)
Patient ID: Sarah Peck, female   DOB: 02-07-1961, 58 y.o.   MRN: [id number] Virtual Visit via Telephone Note  I connected with Sarah Peck on 02/07/19 at 10:30 AM EDT by telephone and verified that I am speaking with the correct person using two identifiers.   Consent:  I discussed the limitations, risks, security and privacy concerns of performing an evaluation and management service by telephone and the availability of in person appointments. I also discussed with the patient that there may be a patient responsible charge related to this service. The patient expressed understanding and agreed to proceed.  Location of patient: The patient was at home  Location of provider: I was in my office  Persons participating in the televisit with the patient.   Interpreter Lewis was on the line as well    History of Present Illness: This is a 58 year old female who tested positive in a recent community event for COVID.  This is a telephone visit to follow-up on symptoms.  Note her husband also was at the same event and tested positive for COVID.   The original event in which the patient was tested was on June 6.  The patient states since that time her symptoms have not took nearly totally resolved.  She only has minimal low back pain.  She is not short of breath.  There is no GI complaints.  She has no other symptoms noted.  She has had no fever.  She is been over 10 days since the beginning of her symptoms.  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  She has been staying home in isolation.  She is not sure whether her employer was requiring a release to back to work note.  She will check on this piece.  Pos in BOLD Constitutional:   No  weight loss, night sweats,  Fevers, chills, fatigue, lassitude. HEENT:   No headaches,  Difficulty swallowing,  Tooth/dental problems,  Sore throat,                No sneezing, itching, ear ache, nasal congestion, post nasal drip,   CV:  No chest pain,   Orthopnea, PND, swelling in lower extremities, anasarca, dizziness, palpitations  GI  No heartburn, indigestion, abdominal pain, nausea, vomiting, diarrhea, change in bowel habits, loss of appetite  Resp: No shortness of breath with exertion or at rest.  No excess mucus, no productive cough,  No non-productive cough,  No coughing up of blood.  No change in color of mucus.  No wheezing.  No chest wall deformity  Skin: no rash or lesions.  GU: no dysuria, change in color of urine, no urgency or frequency.  No flank pain.  MS:  No joint pain or swelling.  No decreased range of motion.  No back pain.  Psych:  No change in mood or affect. No depression or anxiety.  No memory loss.  Observations/Objective: No observations this is a telephone visit  Assessment and Plan: #1 COVID viral pharyngitis infection now resolved  Clinically this patient can be released from isolation and return to work.  She will check and see if her workplace will require repeat testing and if will require a work note  We gave the patient primary care options in our community care clinics that she can choose from for future primary care appointments  Follow Up Instructions: The patient knows she may be released to work and no longer needs to be in isolation  I discussed the assessment and treatment plan with the patient. The patient was provided an opportunity to ask questions and all were answered. The patient agreed with the plan and demonstrated an understanding of the instructions.   The patient was advised to call back or seek an in-person evaluation if the symptoms worsen or if the condition fails to improve as anticipated.  I provided 20 minutes of non-face-to-face time during this encounter  including  median intraservice time , review of notes, labs, imaging, medications  and explaining diagnosis and management to the patient .    Shan LevansPatrick , MD

## 2019-02-08 ENCOUNTER — Ambulatory Visit: Payer: BLUE CROSS/BLUE SHIELD | Attending: Critical Care Medicine | Admitting: Critical Care Medicine

## 2019-02-08 ENCOUNTER — Encounter: Payer: Self-pay | Admitting: Critical Care Medicine

## 2019-02-08 ENCOUNTER — Other Ambulatory Visit: Payer: Self-pay

## 2019-02-08 DIAGNOSIS — U071 COVID-19: Secondary | ICD-10-CM | POA: Diagnosis not present

## 2019-02-08 DIAGNOSIS — J029 Acute pharyngitis, unspecified: Secondary | ICD-10-CM

## 2019-02-08 NOTE — Progress Notes (Signed)
Patient verified DOB Patient has not taken medication today. Patient has not eaten today. Patient complains of back pain constantly. Patient complains muscle aches. Patient denies any N/V/fever/SOB Patient complains of intermittent cough with yellow sputum. Patient states her husband tested positive and is still having current symptoms with fatigue and cough.

## 2019-03-17 ENCOUNTER — Other Ambulatory Visit: Payer: Self-pay | Admitting: Physician Assistant

## 2019-06-01 ENCOUNTER — Other Ambulatory Visit: Payer: Self-pay | Admitting: Family Medicine

## 2019-06-01 DIAGNOSIS — Z1231 Encounter for screening mammogram for malignant neoplasm of breast: Secondary | ICD-10-CM

## 2019-07-16 ENCOUNTER — Other Ambulatory Visit: Payer: Self-pay

## 2019-07-16 ENCOUNTER — Ambulatory Visit
Admission: RE | Admit: 2019-07-16 | Discharge: 2019-07-16 | Disposition: A | Discharge status: Home / Self Care | Payer: BLUE CROSS/BLUE SHIELD | Source: Ambulatory Visit | Attending: Family Medicine | Admitting: Family Medicine

## 2019-07-16 DIAGNOSIS — Z1231 Encounter for screening mammogram for malignant neoplasm of breast: Secondary | ICD-10-CM

## 2020-10-30 ENCOUNTER — Ambulatory Visit (INDEPENDENT_AMBULATORY_CARE_PROVIDER_SITE_OTHER): Payer: 59

## 2020-10-30 ENCOUNTER — Ambulatory Visit (INDEPENDENT_AMBULATORY_CARE_PROVIDER_SITE_OTHER): Payer: 59 | Admitting: Family Medicine

## 2020-10-30 ENCOUNTER — Encounter: Payer: Self-pay | Admitting: Family Medicine

## 2020-10-30 ENCOUNTER — Other Ambulatory Visit: Payer: Self-pay

## 2020-10-30 VITALS — BP 130/66 | HR 76 | Ht 62.0 in | Wt 158.0 lb

## 2020-10-30 DIAGNOSIS — G44209 Tension-type headache, unspecified, not intractable: Secondary | ICD-10-CM

## 2020-10-30 DIAGNOSIS — R519 Headache, unspecified: Secondary | ICD-10-CM | POA: Diagnosis not present

## 2020-10-30 DIAGNOSIS — E785 Hyperlipidemia, unspecified: Secondary | ICD-10-CM | POA: Diagnosis not present

## 2020-10-30 DIAGNOSIS — Z23 Encounter for immunization: Secondary | ICD-10-CM

## 2020-10-30 DIAGNOSIS — G510 Bell's palsy: Secondary | ICD-10-CM | POA: Diagnosis not present

## 2020-10-30 MED ORDER — NAPROXEN 500 MG PO TABS: 500.0000 mg | ORAL_TABLET | Freq: Two times a day (BID) | ORAL | 0 refills | Status: DC

## 2020-10-30 MED ORDER — BACLOFEN 5 MG PO TABS: 5.0000 mg | ORAL_TABLET | Freq: Three times a day (TID) | ORAL | 0 refills | Status: DC | PRN

## 2020-10-30 NOTE — Progress Notes (Signed)
   SUBJECTIVE:   CHIEF COMPLAINT / HPI:   Chief Complaint  Patient presents with  . Establish Care   Sarah Peck Spanish interpreter encounter.  Sarah Peck is a 60 y.o. female here for to establish care.  She also complains of left-sided facial pain that is worse with eating.  She feels like her jaw is unstable.  She also has a headache with associated neck pain.  She has history of Bell's palsy.      PERTINENT  PMH / PSH: reviewed and updated as appropriate   OBJECTIVE:   BP 130/66   Pulse 76   Ht 5\' 2"  (1.575 m)   Wt 158 lb (71.7 kg)   LMP 09/07/2012   SpO2 99%   BMI 28.90 kg/m    GEN: pleasant well-appearing female, in no acute distress CV: regular rate and rhythm, no murmurs appreciated RESP: no increased work of breathing, clear to ascultation bilaterally ABD: Bowel sounds present, soft, nontender, nondistended MSK: no lower extremity edema, strength 5/5 bilateral upper and lower extremities.   SKIN: warm, dry, brisk cap refill NEURO: Mild left-sided facial droop, fluent speech, moves all extremities appropriately    ASSESSMENT/PLAN:   Headache History and exam are consistent with tension headache.  Patient with history of migraines but her systems are not consistent with migraines she has had before. Obtain CMP to look for electrolyte abnormalities.  Treat with Naprosyn and baclofen.  Discussed moist heat and stretching.  Follow-up if not improving.  Dyslipidemia  lipid panel today.  Bell's palsy Previous episode of Bell's possibly resolved within 3 months however this episode continues to linger.  Previously followed with neurology who wanted to obtain an MRI but did not secondary to insurance.  Patient now has insurance.  Per neurology, could not rule out CVA.  Patient still has left-sided weakness in her jaw and facial droop.  She has ability to raise her eyebrows today.  Consider neurology for referral at follow-up.   Needs PAP smear at follow up.    09/09/2012, DO PGY-2, [facility] 10/30/2020

## 2020-10-30 NOTE — Patient Instructions (Addendum)
It was great seeing you today!  Please check-out at the front desk before leaving the clinic. I'd like to see you back in on 3/21 at 9 :50 am but if you need to be seen earlier than that for any new issues we're happy to fit you in, just give [place] a call!  Visit Remembers: - Stop by the pharmacy to pick up your prescriptions  - Continue to work on your healthy eating habits and incorporating exercise into your daily life. - Your goal is to have an BP < 130 / 80  - Medicine Changes: Start taking Naproxen 500 mg twice a day  and muscle relaxers. Take baclofen (muscle relaxers).    Call us if: If you pain is not getting better in about 2 weeks.    Regarding lab work today:  Due to recent changes in healthcare laws, you may see the results of your imaging and laboratory studies on MyChart before your provider has had a chance to review them.  I understand that in some cases there may be results that are confusing or concerning to you. Not all laboratory results come back in the same time frame and you may be waiting for multiple results in order to interpret others.  Please give [place] 72 hours in order for your provider to thoroughly review all the results before contacting the office for clarification of your results. If everything is normal, you will get a letter in the mail or a message in My Chart. Please give [place] a call if you do not hear from [place] after 2 weeks.  Please bring all of your medications with you to each visit.    If you haven't already, sign up for My Chart to have easy access to your labs results, and communication with your primary care physician.  Feel free to call with any questions or concerns at any time, at [phone].   Take care,  Dr. Katherina [facility]

## 2020-11-02 DIAGNOSIS — G510 Bell's palsy: Secondary | ICD-10-CM | POA: Insufficient documentation

## 2020-11-02 DIAGNOSIS — R519 Headache, unspecified: Secondary | ICD-10-CM | POA: Insufficient documentation

## 2020-11-02 NOTE — Assessment & Plan Note (Addendum)
History and exam are consistent with tension headache.  Patient with history of migraines but her systems are not consistent with migraines she has had before. Obtain CMP to look for electrolyte abnormalities.  Treat with Naprosyn and baclofen.  Discussed moist heat and stretching.  Follow-up if not improving.

## 2020-11-02 NOTE — Assessment & Plan Note (Addendum)
- 

## 2020-11-02 NOTE — Assessment & Plan Note (Signed)
Previous episode of Bell's possibly resolved within 3 months however this episode continues to linger.  Previously followed with neurology who wanted to obtain an MRI but did not secondary to insurance.  Patient now has insurance.  Per neurology, could not rule out CVA.  Patient still has left-sided weakness in her jaw and facial droop.  She has ability to raise her eyebrows today.  Consider neurology for referral at follow-up.

## 2020-11-13 ENCOUNTER — Other Ambulatory Visit: Payer: Self-pay

## 2020-11-13 ENCOUNTER — Ambulatory Visit (INDEPENDENT_AMBULATORY_CARE_PROVIDER_SITE_OTHER): Payer: 59 | Admitting: Family Medicine

## 2020-11-13 VITALS — BP 122/80 | HR 80 | Wt 159.4 lb

## 2020-11-13 DIAGNOSIS — J310 Chronic rhinitis: Secondary | ICD-10-CM

## 2020-11-13 DIAGNOSIS — Z124 Encounter for screening for malignant neoplasm of cervix: Secondary | ICD-10-CM | POA: Diagnosis not present

## 2020-11-13 DIAGNOSIS — E785 Hyperlipidemia, unspecified: Secondary | ICD-10-CM

## 2020-11-13 DIAGNOSIS — R519 Headache, unspecified: Secondary | ICD-10-CM | POA: Diagnosis not present

## 2020-11-13 DIAGNOSIS — G510 Bell's palsy: Secondary | ICD-10-CM

## 2020-11-13 MED ORDER — LORATADINE 10 MG PO TBDP: 10.0000 mg | ORAL_TABLET | Freq: Every day | ORAL | 12 refills | Status: DC

## 2020-11-13 NOTE — Progress Notes (Signed)
   SUBJECTIVE:   CHIEF COMPLAINT / HPI:   Chief Complaint  Patient presents with  . Facial Pain    left   A Spanish interpreter was used for this encounter.   Sarah Peck is a 60 y.o. female here for left sided facial pain.   Pt reports left sided facial pain that "feels almost like a fever", pain described as pressure. She has tried Tylenol and Advil with some relief. She has difficulty chewing her food. States it is hard to "move it well." Had Bell Palsy in 2013 and again in March 2020. Previously seen Dr. Mariam Dollar at [facility] previously who wanted to get an MRI brain. The pain is constant. She has tearing of her left eye. Reports intermittent headache. No vision changes. Vast improvement in her neck pain since last visit.   Phlegm: Reports for several years she has had white phlegm. Feels it run it down the back Coughs rarely.  No shortness of breath, fevers.     PERTINENT  PMH / PSH: reviewed and updated as appropriate   OBJECTIVE:   BP 122/80   Pulse 80   Wt 159 lb 6 oz (72.3 kg)   LMP 09/07/2012   SpO2 98%   BMI 29.15 kg/m    GEN: well appearing female, in no acute distress  CV: regular rate and rhythm, no murmurs appreciated  RESP: no increased work of breathing, clear to ascultation bilaterally  MSK: no LE edema, or calf tenderness SKIN: warm, dry NEURO: CN 2-12 grossly intact, decreases left sensation compared to right V2, able to raise bilateral forehead, upper and lower extremity strength 5/5, moves all extremities appropriately, normal gait    ASSESSMENT/PLAN:   Dyslipidemia Lipid panel today.    Chronic rhinitis Start Claritin daily. Pt to follow up if sx are not improving.   Facial pain Previously followed with [facility]. Mariam [facility] at [facility] however pt has new insurance. Per chart review, planned or MR Brain to evaluate possible CVA. History concerning for CVA given described motor weakness in left jaw.  She has taken Trileptal in the past for  pain but she is no longer taking this medication. Referral for [facility] neurology placed. Ordered MR Brain to rule out structural cause.      Katha Cabal, DO PGY-2,  Family Medicine 11/13/2020

## 2020-11-13 NOTE — Assessment & Plan Note (Signed)
Lipid panel today

## 2020-11-13 NOTE — Assessment & Plan Note (Addendum)
Previously followed with Dr. Mariam Dollar at [facility] however pt has new insurance. Per chart review, planned or MR Brain to evaluate possible CVA. History concerning for CVA given described motor weakness in left jaw.  She has taken Trileptal in the past for pain but she is no longer taking this medication. Referral for [facility] neurology placed. Ordered MR Brain to rule out structural cause.

## 2020-11-13 NOTE — Patient Instructions (Signed)
¡  Fue genial verte hoy!  Hice una remisin para que vea a un neurlogo acerca de su dolor y debilidad en la cara izquierda. Alguien debera llamarlo para programar esa cita.  [facility] [place] [address], [place], [place] [address]  [phone]  Para ti flema pasa por la farmacia a recoger tus medicamentos. Comience a tomar Claritin una vez al da.    Si tiene preguntas o inquietudes, no dude en llamar al [phone].  [facility]  It was great seeing you today!  I placed a referral for you to see a neurologist about your left facial pain and weakness. Someone should call you to schedule that appointment.   [facility]  [address], [place], [place] [address]  [phone]  For you phlegm stop by the pharmacy to pick up your medications. Start taking Claritin once daily.     If you have questions or concerns please do not hesitate to call at [phone].  Dr. Katherina [facility]

## 2020-11-13 NOTE — Assessment & Plan Note (Addendum)
Start Claritin daily. Pt to follow up if sx are not improving.

## 2020-11-15 ENCOUNTER — Encounter: Payer: Self-pay | Admitting: Family Medicine

## 2020-11-15 LAB — COMPREHENSIVE METABOLIC PANEL
ALT: 47 IU/L — ABNORMAL HIGH (ref 0–32)
AST: 40 IU/L (ref 0–40)
Albumin/Globulin Ratio: 1.5 (ref 1.2–2.2)
Albumin: 4.4 g/dL (ref 3.8–4.9)
Alkaline Phosphatase: 129 IU/L — ABNORMAL HIGH (ref 44–121)
BUN/Creatinine Ratio: 14 (ref 9–23)
BUN: 10 mg/dL (ref 6–24)
Bilirubin Total: 0.4 mg/dL (ref 0.0–1.2)
CO2: 21 mmol/L (ref 20–29)
Calcium: 9.2 mg/dL (ref 8.7–10.2)
Chloride: 104 mmol/L (ref 96–106)
Creatinine, Ser: 0.69 mg/dL (ref 0.57–1.00)
Globulin, Total: 3 g/dL (ref 1.5–4.5)
Glucose: 110 mg/dL — ABNORMAL HIGH (ref 65–99)
Potassium: 4.5 mmol/L (ref 3.5–5.2)
Sodium: 142 mmol/L (ref 134–144)
Total Protein: 7.4 g/dL (ref 6.0–8.5)
eGFR: 100 mL/min/{1.73_m2} (ref >59)

## 2020-11-15 LAB — LIPID PANEL
Chol/HDL Ratio: 5.1 ratio — ABNORMAL HIGH (ref 0.0–4.4)
Cholesterol, Total: 221 mg/dL — ABNORMAL HIGH (ref 100–199)
HDL: 43 mg/dL (ref >39)
LDL Chol Calc (NIH): 144 mg/dL — ABNORMAL HIGH (ref 0–99)
Triglycerides: 186 mg/dL — ABNORMAL HIGH (ref 0–149)
VLDL Cholesterol Cal: 34 mg/dL (ref 5–40)

## 2020-11-20 ENCOUNTER — Telehealth: Payer: Self-pay

## 2020-11-20 NOTE — Telephone Encounter (Signed)
Spoke with pt informed of MRI at [facility] 4/1 at 1:30 entrance  C. Pt understood.  Aquilla Solian, CMA

## 2020-11-20 NOTE — Telephone Encounter (Signed)
-----   Message from Henri Medal, [place] sent at 11/20/2020 10:41 AM EDT ----- Regarding: Needs MRI Hey this is good to schedule.   MRI authorized for [facility]. Auth# [id number] and valid from 11/14/2020-02/14/2021. Jazmin Hartsell,CMA

## 2020-11-24 ENCOUNTER — Other Ambulatory Visit: Payer: Self-pay

## 2020-11-24 ENCOUNTER — Ambulatory Visit (HOSPITAL_COMMUNITY)
Admission: RE | Admit: 2020-11-24 | Discharge: 2020-11-24 | Disposition: A | Discharge status: Home / Self Care | Payer: 59 | Source: Ambulatory Visit | Attending: Family Medicine | Admitting: Family Medicine

## 2020-11-24 DIAGNOSIS — G510 Bell's palsy: Secondary | ICD-10-CM | POA: Diagnosis not present

## 2020-11-24 DIAGNOSIS — R519 Headache, unspecified: Secondary | ICD-10-CM | POA: Diagnosis present

## 2021-01-19 ENCOUNTER — Encounter: Payer: Self-pay | Admitting: Diagnostic Neuroimaging

## 2021-01-19 ENCOUNTER — Ambulatory Visit (INDEPENDENT_AMBULATORY_CARE_PROVIDER_SITE_OTHER): Payer: 59 | Admitting: Diagnostic Neuroimaging

## 2021-01-19 VITALS — BP 120/78 | HR 71 | Ht 62.0 in | Wt 156.8 lb

## 2021-01-19 DIAGNOSIS — G510 Bell's palsy: Secondary | ICD-10-CM | POA: Diagnosis not present

## 2021-01-19 NOTE — Patient Instructions (Signed)
LEFT BELL'S PALSY (March 2020; partial recovery) - stable; continue supportive care

## 2021-01-19 NOTE — Progress Notes (Signed)
[facility]  PATIENT: Sarah Peck DOB: 1961/02/16  REFERRING CLINICIAN: Carney Living, * HISTORY FROM: patient  REASON FOR VISIT: new consult    HISTORICAL  CHIEF COMPLAINT:  Chief Complaint  Patient presents with  . New Patient (Initial Visit)    RM 6, w/ translator Lily, bells palsy on left side of face, onset 11/08/18. Right heel swelling: pt walks all day and by end of day foot swells. Would like to know result of MR of brain.     HISTORY OF PRESENT ILLNESS:   60 year old female here for evaluation of left Bell's palsy.  March 2020 patient had onset of left upper lower facial weakness.  She was evaluated at urgent care and was unable to close her left eye.  She was having some pain in the left face.  She was prescribed prednisone, valacyclovir and eyedrops.  She followed up with neurology consult in December 2020.  She had MRI of the brain without contrast ordered by PCP, done on 11/24/2020 which was unremarkable.  Overall her facial weakness symptoms have partially improved since March 2020 but not fully resolved.  Her facial pain has significantly improved.   REVIEW OF SYSTEMS: Full 14 system review of systems performed and negative with exception of: As per HPI.  ALLERGIES: No Known Allergies  HOME MEDICATIONS: Outpatient Medications Prior to Visit  Medication Sig Dispense Refill  . ibuprofen (ADVIL) 200 MG tablet Take 200 mg by mouth every 6 (six) hours as needed.    . Baclofen 5 MG TABS Take 5 mg by mouth 3 (three) times daily as needed. 30 tablet 0  . loratadine (CLARITIN REDITABS) 10 MG dissolvable tablet Take 1 tablet (10 mg total) by mouth daily. As needed for allergy symptoms 31 tablet 12  . naproxen (NAPROSYN) 500 MG tablet Take 1 tablet (500 mg total) by mouth 2 (two) times daily with a meal. 30 tablet 0   No facility-administered medications prior to visit.    PAST MEDICAL HISTORY: Past Medical History:  Diagnosis Date  .  Allergy   . Anemia   . Arthritis   . COVID-19 virus detected 02/02/2019    PAST SURGICAL HISTORY: Past Surgical History:  Procedure Laterality Date  . TUBAL LIGATION  2004    FAMILY HISTORY: History reviewed. No pertinent family history.  SOCIAL HISTORY: Social History   Socioeconomic History  . Marital status: Widowed    Spouse name: Not on file  . Number of children: 6  . Years of education: 2  . Highest education level: Not on file  Occupational History    Employer: [employer]  Tobacco Use  . Smoking status: Never Smoker  . Smokeless tobacco: Never Used  Substance and Sexual Activity  . Alcohol use: Yes  . Drug use: No  . Sexual activity: Yes    Birth control/protection: Other-see comments  Other Topics Concern  . Not on file  Social History Narrative   Separated many years from alcoholic husband   [place], 4 children here   Native of [place]   Parents, 2 sons ages 25 and 54 and 5 grandchildren still live there      Lives with son and I grandchild   caffeine- 1 cup in the morning   Right and left handed   Social Determinants of Health   Financial Resource Strain: Not on file  Food Insecurity: Not on file  Transportation Needs: Not on file  Physical  Activity: Not on file  Stress: Not on file  Social Connections: Not on file  Intimate Partner Violence: Not on file     PHYSICAL EXAM  GENERAL EXAM/CONSTITUTIONAL: Vitals:  Vitals:   01/19/21 1028  BP: 120/78  Pulse: 71  Weight: 156 lb 12.8 oz (71.1 kg)  Height: 5\' 2"  (1.575 m)     Body mass index is 28.68 kg/m. Wt Readings from Last 3 Encounters:  01/19/21 156 lb 12.8 oz (71.1 kg)  11/13/20 159 lb 6 oz (72.3 kg)  10/30/20 158 lb (71.7 kg)     Patient is in no distress; well developed, nourished and groomed; neck is supple  CARDIOVASCULAR:  Examination of carotid arteries is normal; no carotid bruits  Regular rate and rhythm, no  murmurs  Examination of peripheral vascular system by observation and palpation is normal  EYES:  Ophthalmoscopic exam of optic discs and posterior segments is normal; no papilledema or hemorrhages  No exam data present  MUSCULOSKELETAL:  Gait, strength, tone, movements noted in Neurologic exam below  NEUROLOGIC: MENTAL STATUS:  No flowsheet data found.  awake, alert, oriented to person, place and time  recent and remote memory intact  normal attention and concentration  language fluent, comprehension intact, naming intact  fund of knowledge appropriate  CRANIAL NERVE:   2nd - no papilledema on fundoscopic exam  2nd, 3rd, 4th, 6th - pupils equal and reactive to light, visual fields full to confrontation, extraocular muscles intact, no nystagmus  5th - facial sensation symmetric  7th - facial strength --> SLIGHTLY DECR LEFT EYE BROW RAISE; MILD LEFT PTOSIS; MODERATE WEAKNESS OF LEFT LOWER FACE  8th - hearing intact  9th - palate elevates symmetrically, uvula midline  11th - shoulder shrug symmetric  12th - tongue protrusion midline  MOTOR:   normal bulk and tone, full strength in the BUE, BLE  SENSORY:   normal and symmetric to light touch, pinprick, temperature, vibration  COORDINATION:   finger-nose-finger, fine finger movements normal  REFLEXES:   deep tendon reflexes present and symmetric  GAIT/STATION:   narrow based gait     DIAGNOSTIC DATA (LABS, IMAGING, TESTING) - I reviewed patient records, labs, notes, testing and imaging myself where available.  Lab Results  Component Value Date   WBC 4.4 06/21/2016   HGB 14.4 06/21/2016   HCT 42.5 06/21/2016   MCV 86.4 06/21/2016   PLT 307 06/21/2016      Component Value Date/Time   NA 142 11/13/2020 1034   K 4.5 11/13/2020 1034   CL 104 11/13/2020 1034   CO2 21 11/13/2020 1034   GLUCOSE 110 (H) 11/13/2020 1034   GLUCOSE 101 (H) 06/21/2016 1013   BUN 10 11/13/2020 1034   CREATININE  0.69 11/13/2020 1034   CREATININE 0.58 06/21/2016 1013   CALCIUM 9.2 11/13/2020 1034   PROT 7.4 11/13/2020 1034   ALBUMIN 4.4 11/13/2020 1034   AST 40 11/13/2020 1034   ALT 47 (H) 11/13/2020 1034   ALKPHOS 129 (H) 11/13/2020 1034   BILITOT 0.4 11/13/2020 1034   GFRNONAA >89 06/21/2016 1013   GFRAA >89 06/21/2016 1013   Lab Results  Component Value Date   CHOL 221 (H) 11/13/2020   HDL 43 11/13/2020   LDLCALC 144 (H) 11/13/2020   LDLDIRECT 122 (H) 08/28/2009   TRIG 186 (H) 11/13/2020   CHOLHDL 5.1 (H) 11/13/2020   No results found for: HGBA1C No results found for: VITAMINB12 Lab Results  Component Value Date   TSH 2.42 06/21/2016  11/24/20 MRI brain [I reviewed images myself and agree with interpretation. -VRP]  - Unremarkable appearance of the brain for age.    ASSESSMENT AND PLAN  60 y.o. year old female here with left upper and lower facial weakness in March 2020 consistent with Bell's palsy.  Initially had numbness and pain with symptoms.  MRI of the brain has been unremarkable.  Symptoms have improved partially since that time.  Dx:  1. Left-sided Bell's palsy     PLAN:  LEFT BELL'S PALSY (March 2020; partial recovery) - stable; continue supportive care  Return for return to PCP.    Suanne Marker, MD 01/19/2021, 10:38 AM Certified in Neurology, Neurophysiology and Neuroimaging  [facility] [address], [address] [place], [place] [address] [phone]

## 2021-05-14 ENCOUNTER — Other Ambulatory Visit: Payer: Self-pay | Admitting: Family Medicine

## 2021-05-14 DIAGNOSIS — Z1231 Encounter for screening mammogram for malignant neoplasm of breast: Secondary | ICD-10-CM

## 2021-05-16 ENCOUNTER — Ambulatory Visit: Payer: 59

## 2021-08-02 ENCOUNTER — Ambulatory Visit: Payer: 59

## 2021-09-05 ENCOUNTER — Ambulatory Visit: Payer: 59

## 2021-10-03 ENCOUNTER — Ambulatory Visit (INDEPENDENT_AMBULATORY_CARE_PROVIDER_SITE_OTHER): Payer: 59

## 2021-10-03 ENCOUNTER — Other Ambulatory Visit: Payer: Self-pay

## 2021-10-03 DIAGNOSIS — Z1231 Encounter for screening mammogram for malignant neoplasm of breast: Secondary | ICD-10-CM | POA: Diagnosis not present

## 2021-11-13 ENCOUNTER — Ambulatory Visit (INDEPENDENT_AMBULATORY_CARE_PROVIDER_SITE_OTHER): Payer: 59 | Admitting: Family Medicine

## 2021-11-13 ENCOUNTER — Other Ambulatory Visit (HOSPITAL_COMMUNITY)
Admission: RE | Admit: 2021-11-13 | Discharge: 2021-11-13 | Disposition: A | Discharge status: Home / Self Care | Payer: 59 | Source: Ambulatory Visit | Attending: Family Medicine | Admitting: Family Medicine

## 2021-11-13 ENCOUNTER — Other Ambulatory Visit: Payer: Self-pay

## 2021-11-13 VITALS — BP 132/77 | HR 76 | Ht 62.0 in | Wt 159.4 lb

## 2021-11-13 DIAGNOSIS — Z124 Encounter for screening for malignant neoplasm of cervix: Secondary | ICD-10-CM | POA: Diagnosis present

## 2021-11-13 DIAGNOSIS — M5410 Radiculopathy, site unspecified: Secondary | ICD-10-CM

## 2021-11-13 DIAGNOSIS — Z113 Encounter for screening for infections with a predominantly sexual mode of transmission: Secondary | ICD-10-CM | POA: Diagnosis not present

## 2021-11-13 MED ORDER — GABAPENTIN 100 MG PO CAPS: 100.0000 mg | ORAL_CAPSULE | Freq: Three times a day (TID) | ORAL | 3 refills | Status: DC

## 2021-11-13 MED ORDER — NAPROXEN 500 MG PO TABS: 500.0000 mg | ORAL_TABLET | Freq: Two times a day (BID) | ORAL | 0 refills | Status: DC

## 2021-11-13 NOTE — Progress Notes (Signed)
? ?  SUBJECTIVE:  ? ?CHIEF COMPLAINT / HPI:  ? ?A Spanish interpreter was used for this encounter:  Name: Sarah Peck ID #[id number] ? ?Sarah Peck is a 61 y.o. female here for PAP Smear and to discuss foot pain.   ? ?Pt reports left foot pain that radiates to her lateral left hip.  Pain began about a month ago and is described as sharp and achy.  Walking makes pain worse.  Better with rest and sleep.  She tried Advil but that only lasts short-term.  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  Denies trauma.  Has had similar pain in the past. ? ?Sarah Peck denies smoking and illicit drug use.  Occasionally has a alcoholic beverage in social situations with her friends and family but not on a defined regular basis.  ? ? ?PERTINENT  PMH / PSH: reviewed and updated as appropriate  ? ?OBJECTIVE:  ? ?BP 132/77   Pulse 76   Ht 5\' 2"  (1.575 m)   Wt 159 lb 6 oz (72.3 kg)   LMP 09/07/2012   SpO2 97%   BMI 29.15 kg/m?   ? ?GEN: well appearing female in no acute distress  ?CVS: well perfused  ?RESP: speaking in full sentences without pause, no respiratory distress  ?GU: Pelvic exam: normal external genitalia, vulva, vagina, cervix, uterus and adnexa, PAP: Pap smear done today, exam chaperoned by CMA Sarah Peck. ?MSK: Right left ?Inspection: No erythema, edema, ecchymosis or bony deformity, no bone pes planus or cavus deformity, transverse and medial arches intact ?Palpation:  No bony or tendonous tenderness ?ROM: Full active and passive range of motion ?Strength: 5/5 in all directions ?No ligamentous laxity ?No pain at the base of the fifth metatarsal ?Able to ambulate with minimal pain  ?-Neurovascularly intact, no instability noted ? ? ?ASSESSMENT/PLAN:  ? ? ?Left lower extremity radiculopathy ?History concerning for neurogenic pain. Restart gabapentin. Start Naprosyn 500 mg twice daily as needed with food. ? ?Cervical cancer screening ?Reviewed previous Pap smear from June 2015 that was negative however transformation zone  was not present.  Pap performed today.  HIV, RPR, gonorrhea and Chlamydia testing offered and patient agreed.  ? ?Healthcare maintenance: ?Pap smear as above ?Colonoscopy up-to-date, due in 2027 ?Mammogram: Up-to-date, February Peck  reviewed and was negative ?Vaccines: Declined COVID booster and flu.  Discussed shingles vaccine at next visit. ? ?Bell's palsy ?Left sided. She notes no improvement. Reviewed neurology notes. MRI unremarkable.  ? ?Peck 2023, DO ?PGY-3, [facility] ?3/21/Peck  ? ? ? ? ? ? ? ? ?

## 2021-11-13 NOTE — Patient Instructions (Addendum)
Stop by the pharmacy to pick up your pain medications. Take the gabapentin at bedtime first, as this can cause you to be sleepy.  Take Naprosyn with food to avoid stomach irritation.  ? ?I will send a letter in the mail with your PAP smear results and STD testing.   ? ? ?Take Care, ? ?Dr. Susa Simmonds  ?

## 2021-11-15 ENCOUNTER — Encounter: Payer: Self-pay | Admitting: Family Medicine

## 2021-11-15 LAB — T PALLIDUM ANTIBODY, EIA: T pallidum Antibody, EIA: NEGATIVE

## 2021-11-15 LAB — HIV ANTIBODY (ROUTINE TESTING W REFLEX): HIV Screen 4th Generation wRfx: NONREACTIVE

## 2021-11-15 LAB — RPR W/REFLEX TO TREPSURE: RPR: NONREACTIVE

## 2021-11-16 LAB — CYTOLOGY - PAP
Chlamydia: NEGATIVE
Comment: NEGATIVE
Comment: NEGATIVE
Comment: NORMAL
Diagnosis: UNDETERMINED — AB
High risk HPV: NEGATIVE
Neisseria Gonorrhea: NEGATIVE

## 2022-04-21 ENCOUNTER — Other Ambulatory Visit: Payer: Self-pay | Admitting: Family Medicine

## 2022-04-21 DIAGNOSIS — M5410 Radiculopathy, site unspecified: Secondary | ICD-10-CM

## 2022-09-04 ENCOUNTER — Other Ambulatory Visit: Payer: Self-pay | Admitting: Student

## 2022-09-04 DIAGNOSIS — Z1231 Encounter for screening mammogram for malignant neoplasm of breast: Secondary | ICD-10-CM

## 2022-10-25 ENCOUNTER — Ambulatory Visit: Payer: Self-pay

## 2022-10-28 ENCOUNTER — Ambulatory Visit (INDEPENDENT_AMBULATORY_CARE_PROVIDER_SITE_OTHER): Payer: BLUE CROSS/BLUE SHIELD | Admitting: Student

## 2022-10-28 VITALS — BP 120/70 | HR 85 | Ht 62.0 in | Wt 153.0 lb

## 2022-10-28 DIAGNOSIS — M545 Low back pain, unspecified: Secondary | ICD-10-CM

## 2022-10-28 DIAGNOSIS — H109 Unspecified conjunctivitis: Secondary | ICD-10-CM | POA: Diagnosis not present

## 2022-10-28 DIAGNOSIS — N939 Abnormal uterine and vaginal bleeding, unspecified: Secondary | ICD-10-CM

## 2022-10-28 LAB — POCT URINALYSIS DIP (MANUAL ENTRY)
Bilirubin, UA: NEGATIVE
Glucose, UA: NEGATIVE mg/dL
Ketones, POC UA: NEGATIVE mg/dL
Nitrite, UA: NEGATIVE
Protein Ur, POC: NEGATIVE mg/dL
Spec Grav, UA: <=1.005 — AB (ref 1.010–1.025)
Urobilinogen, UA: 0.2 E.U./dL
pH, UA: 6.5 (ref 5.0–8.0)

## 2022-10-28 LAB — POCT UA - MICROSCOPIC ONLY

## 2022-10-28 MED ORDER — POLYMYXIN B-TRIMETHOPRIM 10000-0.1 UNIT/ML-% OP SOLN: 1.0000 [drp] | OPHTHALMIC | 0 refills | Status: DC

## 2022-10-28 NOTE — Patient Instructions (Addendum)
It was great to see you! Thank you for allowing me to participate in your care!  Our plans for today:  - Envi una receta para gotas para los ojos. Si tus ojos empiezan a empeorar, puedes recogerlos en la farmacia. Una gota en [place] 4 horas durante 7-10 das.  Sarah Peck ecografa para observar su tero y le program una biopsia endometrial para buscar problemas uterinos como causa de su sangrado.  Take care and seek immediate care sooner if you develop any concerns.   Dr. Precious Gilding, DO [facility]

## 2022-10-28 NOTE — Assessment & Plan Note (Signed)
As there was no significant blood on microscopic urinalysis, it is possible that the blood is coming from the vagina.  As she is postmenopausal this is concerning for uterine malignancy.  Patient has been scheduled for a transvaginal ultrasound on March 11 and for a uterine biopsy on March 14.

## 2022-10-28 NOTE — Assessment & Plan Note (Signed)
This could be the start of a URI and it could be a viral conjunctivitis.  I did go ahead and prescribe Polytrim eyedrops and advised patient that if the eyes become much worse and she develops significant drainage she can pick up the eyedrops and treat for bacterial conjunctivitis.

## 2022-10-28 NOTE — Progress Notes (Signed)
    SUBJECTIVE:   CHIEF COMPLAINT / HPI:   Blood when wiping A week ago felt a "poke" in her tailbone and some lower abdominal pain, went to the pee and the toilet paper was pink when she wiped.  She has had the pink-tinged toilet paper after urinating every time since.  She is unsure if it is coming from her vagina versus urethra.  No dysuria, no increased urinary frequency, no fever, vomiting or nausea. No hx of UTIs.  Started menopause in 2013, has had no vaginal bleeding since. No history of smoking.  Irritated eyes States her right eye started becoming red 2 days ago, now the left eyes becoming irritated.  Describes a burning sensation.  Has had a little bit of yellow drainage.  Has also had some nasal congestion and mild cough.    OBJECTIVE:   BP 120/70   Pulse 85   Ht '5\' 2"'$  (1.575 m)   Wt 153 lb (69.4 kg)   LMP 08/26/2012   SpO2 98%   BMI 27.98 kg/m    General: NAD, pleasant, able to participate in exam HEENT: Injected sclera of eyes, right much worse than left, EOEMI, no drainage from eyes noted.  MMM, no erythema or exudate of oropharynx. Cardiac: RRR, no murmurs. Respiratory: CTAB, normal effort, No wheezes, rales or rhonchi Abdomen: nontender, nondistended, soft Skin: warm and dry Neuro: alert, no obvious focal deficits Psych: Normal affect and mood   ASSESSMENT/PLAN:   Conjunctivitis of both eyes This could be the start of a URI and it could be a viral conjunctivitis.  I did go ahead and prescribe Polytrim eyedrops and advised patient that if the eyes become much worse and she develops significant drainage she can pick up the eyedrops and treat for bacterial conjunctivitis.  Vaginal bleeding As there was no significant blood on microscopic urinalysis, it is possible that the blood is coming from the vagina.  As she is postmenopausal this is concerning for uterine malignancy.  Patient has been scheduled for a transvaginal ultrasound on March 11 and for a uterine  biopsy on March 14.     Dr. Precious Gilding, [place]

## 2022-11-04 ENCOUNTER — Ambulatory Visit (HOSPITAL_COMMUNITY): Payer: BLUE CROSS/BLUE SHIELD

## 2022-11-07 ENCOUNTER — Ambulatory Visit: Payer: BLUE CROSS/BLUE SHIELD

## 2023-03-20 ENCOUNTER — Encounter: Payer: Self-pay | Admitting: Student

## 2023-03-20 ENCOUNTER — Other Ambulatory Visit: Payer: Self-pay

## 2023-03-20 ENCOUNTER — Ambulatory Visit (INDEPENDENT_AMBULATORY_CARE_PROVIDER_SITE_OTHER): Payer: BLUE CROSS/BLUE SHIELD | Admitting: Student

## 2023-03-20 VITALS — BP 114/69 | HR 71 | Ht 62.0 in | Wt 152.6 lb

## 2023-03-20 DIAGNOSIS — M654 Radial styloid tenosynovitis [de Quervain]: Secondary | ICD-10-CM

## 2023-03-20 DIAGNOSIS — N939 Abnormal uterine and vaginal bleeding, unspecified: Secondary | ICD-10-CM

## 2023-03-20 DIAGNOSIS — T148XXA Other injury of unspecified body region, initial encounter: Secondary | ICD-10-CM | POA: Diagnosis not present

## 2023-03-20 DIAGNOSIS — R202 Paresthesia of skin: Secondary | ICD-10-CM

## 2023-03-20 DIAGNOSIS — M722 Plantar fascial fibromatosis: Secondary | ICD-10-CM

## 2023-03-20 DIAGNOSIS — E785 Hyperlipidemia, unspecified: Secondary | ICD-10-CM

## 2023-03-20 MED ORDER — BACLOFEN 10 MG PO TABS: 5.0000 mg | ORAL_TABLET | Freq: Three times a day (TID) | ORAL | 0 refills | Status: DC | PRN

## 2023-03-20 NOTE — Progress Notes (Signed)
    SUBJECTIVE:   CHIEF COMPLAINT / HPI:   Pain in L upper back/shoulder, L wrist and R ankle  Pain in all areas started about 1 month ago No injury. 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   L back/shoulder pain also bad at night, making it difficult to sleep.  Has tried advil which helps a little  Admits to tingling in all fingers of both hands for past week   R heel pain Worse with walking and to touch. On her feet a lot at work.   L wrist pain goes into thumb  Difficult to obtain more accurate history d/t language barrier even with interpreter.   Concern for postmenopausal bleeding Patient had previously been ordered a pelvic ultrasound to evaluate for possible postmenopausal bleeding, we were unsure where it was coming from but she had seen small amount blood on toilet paper when wiping.  Patient states this completely resolved so she did not get ultrasound and is adamant she does not think she needs it.  PERTINENT  PMH / PSH: Trigger finger, Bell's palsy  OBJECTIVE:   BP 114/69   Pulse 71   Ht 5\' 2"  (1.575 m)   Wt 152 lb 9.6 oz (69.2 kg)   LMP 08/26/2012   SpO2 99%   BMI 27.91 kg/m    General: NAD, pleasant, able to participate in exam Cardiac: Well-perfused Respiratory: Breathing comfortably on room air MSK:  No obvious deformities of left upper extremity, no edema or erythema of shoulder, arm, wrist, hand, or fingers.  Muscle strength 5/5 of  BUEs.  Left posterior cervical and upper thoracic musculature (traps and levator scapula) tender to palpation and tense feeling. Negative Spurling test.  Left shoulder pain reproduced with cervical rotation and sidebending right Lateral wrist, thenar eminence tender to palpation and positive Finkelstein's test Right heel tender to palpation, good ROM of right ankle with 5/5 muscle strength Skin: warm and dry, no rashes noted Neuro: alert, no obvious focal deficits Psych: Normal affect and  mood  ASSESSMENT/PLAN:   Paresthesias Could be carpal tunnel although tingling occurs in all fingers of each hand.  Will rule out thyroid issues, anemia, B12 deficiency, electrolyte abnormalities, and cervical nerve impingement. -Cervical spine x-ray -B12 -TMH as needed-CMP -CBC  Plantar fasciitis of right foot Right heel pain likely plantars fasciitis. 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  She may benefit from PT but declines this at this time. -Baclofen 5 mg 3 times daily as needed -Advil and Tylenol as needed -Return if no improvement   Vaginal bleeding Patient states this has resolved, declines pelvic ultrasound.  She is advised to return if bleeding resumes.  Dyslipidemia As we are already getting labs, will go ahead and check lipid panel.  Not currently on a statin.     Dr. Erick Alley, DO  [facility]

## 2023-03-20 NOTE — Patient Instructions (Addendum)
   I recommend getting a splint as shown above for your thumb.  You can order it on [employer] or look for one at a pharmacy.  Wear for as long as symptoms last.  Continue using Advil as needed, follow instructions on the bottle.  You can also take Tylenol 1000 mg every 6 hours as needed  I prescribed you baclofen.  This is a muscle relaxer.  You can take it up to 3 times a day as needed.  Go to [address] for x-ray  If symptoms do not improve over the next few weeks, return for a follow-up visit    Recomiendo colocarse una frula como se muestra arriba para Multimedia programmer.  Puedes pedirlo en [employer] o buscar uno en una farmacia.  selo mientras duren los sntomas.  Contine usando Advil segn sea necesario, siga las instrucciones del frasco.  Tambin puede tomar Tylenol 1000 mg cada 6 horas segn sea necesario.  Te recet baclofeno.  Este es un relajante muscular.  Puedes tomarlo hasta 3 veces al da segn sea necesario.  Vaya a [address] para una radiografa.  Si los sntomas no mejoran en las prximas semanas, regrese para una visita de seguimiento.

## 2023-03-21 ENCOUNTER — Ambulatory Visit
Admission: RE | Admit: 2023-03-21 | Discharge: 2023-03-21 | Disposition: A | Discharge status: Home / Self Care | Payer: BLUE CROSS/BLUE SHIELD | Source: Ambulatory Visit | Attending: Family Medicine | Admitting: Family Medicine

## 2023-03-21 DIAGNOSIS — R202 Paresthesia of skin: Secondary | ICD-10-CM | POA: Insufficient documentation

## 2023-03-21 DIAGNOSIS — M722 Plantar fascial fibromatosis: Secondary | ICD-10-CM | POA: Insufficient documentation

## 2023-03-21 DIAGNOSIS — T148XXA Other injury of unspecified body region, initial encounter: Secondary | ICD-10-CM | POA: Insufficient documentation

## 2023-03-21 DIAGNOSIS — M654 Radial styloid tenosynovitis [de Quervain]: Secondary | ICD-10-CM | POA: Insufficient documentation

## 2023-03-21 NOTE — Assessment & Plan Note (Signed)
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  She may benefit from PT but declines this at this time. -Baclofen 5 mg 3 times daily as needed -Advil and Tylenol as needed -Return if no improvement

## 2023-03-21 NOTE — Assessment & Plan Note (Addendum)
Patient states this has resolved, declines pelvic ultrasound.  She is advised to return if bleeding resumes.

## 2023-03-21 NOTE — Assessment & Plan Note (Signed)
As we are already getting labs, will go ahead and check lipid panel.  Not currently on a statin.

## 2023-03-21 NOTE — Assessment & Plan Note (Signed)
Could be carpal tunnel although tingling occurs in all fingers of each hand.  Will rule out thyroid issues, anemia, B12 deficiency, electrolyte abnormalities, and cervical nerve impingement. -Cervical spine x-ray -B12 -TMH as needed-CMP -CBC

## 2023-03-21 NOTE — Assessment & Plan Note (Signed)
Right heel pain likely plantars fasciitis. -Patient advised to roll foot over a tennis ball or water bottle to stretch it out multiple times per day -Advil as needed -Return if symptoms persist/worsen

## 2023-03-25 ENCOUNTER — Encounter: Payer: Self-pay | Admitting: Student

## 2023-04-21 ENCOUNTER — Ambulatory Visit (INDEPENDENT_AMBULATORY_CARE_PROVIDER_SITE_OTHER): Payer: BLUE CROSS/BLUE SHIELD | Admitting: Student

## 2023-04-21 ENCOUNTER — Encounter: Payer: Self-pay | Admitting: Student

## 2023-04-21 VITALS — BP 125/66 | HR 71 | Ht 62.0 in | Wt 151.0 lb

## 2023-04-21 DIAGNOSIS — T148XXA Other injury of unspecified body region, initial encounter: Secondary | ICD-10-CM | POA: Diagnosis not present

## 2023-04-21 DIAGNOSIS — M722 Plantar fascial fibromatosis: Secondary | ICD-10-CM | POA: Diagnosis not present

## 2023-04-21 MED ORDER — KETOROLAC TROMETHAMINE 30 MG/ML IJ SOLN
30.0000 mg | Freq: Once | INTRAMUSCULAR | Status: AC
  Administered 2023-04-21: 30 mg via INTRAMUSCULAR

## 2023-04-21 NOTE — Patient Instructions (Addendum)
It was great to see you today! Thank you for choosing [facility] for your primary care.  Today we addressed: Continue with Tylenol Use a frozen water bottle under your heel  Stretch the foot in the morning  We will continue with sports medicine referral as well   Hoy abordamos: 1. Continuar con Tylenol 2. [place] una botella de agua congelada debajo del taln.  3. Estira el pie por la [place].  4. Continuaremos tambin con la derivacin a medicina deportiva.  If you haven't already, sign up for My Chart to have easy access to your labs results, and communication with your primary care physician. I recommend that you always bring your medications to each appointment as this makes it easy to ensure you are on the correct medications and helps [place] not miss refills when you need them. Call the clinic at [phone] if your symptoms worsen or you have any concerns. No follow-ups on file. Please arrive 15 minutes before your appointment to ensure smooth check in process.  We appreciate your efforts in making this happen.  Thank you for allowing me to participate in your care, Alfredo Martinez, MD 04/21/2023, 3:55 PM PGY-3, [facility]

## 2023-04-21 NOTE — Progress Notes (Signed)
  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 - Does radiate to the left shoulder but not further down the arm -No weakness noted  -No bowel or bladder incontinence - No rash  - Worsens when walking at work   [employer] interpreter via phone  PERTINENT  PMH / PSH: trigger finger, bells palsy    Patient Care Team: Alfredo Martinez, MD as PCP - General (Family Medicine) OBJECTIVE:  BP 125/66   Pulse 71   Ht 5\' 2"  (1.575 m)   Wt 151 lb (68.5 kg)   LMP 08/26/2012   SpO2 98%   BMI 27.62 kg/m  Physical Exam  General: NAD, pleasant, able to participate in exam Respiratory: No respiratory distress Skin: warm and dry, no rashes noted Psych: Normal affect and mood Ankle/Foot: No gross deformity, swelling, ecchymoses FROM + TTP medial aspect of heel Pain with flexion NV intact distally Neck/Back: Inspection: Normal head position, no muscular atrophy  Palpation: No midline cervical tenderness  TTP over left thoracic paraspinal muscles  Range of Motion: Full range of motion.  Shoulder with FROM  Strength: 5/5 BUE Sensation: Gross sensation intact in neck and upper extremities  ASSESSMENT/PLAN:  Muscle strain Assessment & Plan: Thoracic paraspinal muscle pain, likely related to overuse versus arthritic.  No red flag symptoms on physical exam.  No notable muscle atrophy or muscle weakness.  Will continue with a Toradol injection, had C-spine x-ray showing degenerative changes.  Do not feel she has a fracture given lack of trauma and location of pain  elicited on exam.  Will also continue with referral to sports medicine for further assistance.  May benefit from physical therapy.  Orders: -     Ketorolac Tromethamine -     Ambulatory referral to Sports Medicine  Plantar fasciitis of right foot Assessment & Plan: No red flag symptoms, will continue with conservative treatment measures as outlined in last note with Dr. Yetta Barre in addition to Tylenol/Toradol injection today/sports medicine referral.  Discussed with patient again stretching exercises, provided to patient in AVS as well as rolling foot over iced water bottle.  Orders: -     Ambulatory referral to Sports Medicine    Alfredo Martinez, MD 04/22/2023, 12:12 PM PGY-3, [facility]

## 2023-04-22 NOTE — Assessment & Plan Note (Signed)
No red flag symptoms, will continue with conservative treatment measures as outlined in last note with Dr. Yetta Barre in addition to Tylenol/Toradol injection today/sports medicine referral.  Discussed with patient again stretching exercises, provided to patient in AVS as well as rolling foot over iced water bottle.

## 2023-04-22 NOTE — Assessment & Plan Note (Signed)
Thoracic paraspinal muscle pain, likely related to overuse versus arthritic.  No red flag symptoms on physical exam.  No notable muscle atrophy or muscle weakness.  Will continue with a Toradol injection, had C-spine x-ray showing degenerative changes.  Do not feel she has a fracture given lack of trauma and location of pain elicited on exam.  Will also continue with referral to sports medicine for further assistance.  May benefit from physical therapy.

## 2023-09-02 ENCOUNTER — Other Ambulatory Visit: Payer: Self-pay | Admitting: Student

## 2023-09-02 DIAGNOSIS — Z1231 Encounter for screening mammogram for malignant neoplasm of breast: Secondary | ICD-10-CM

## 2023-09-18 ENCOUNTER — Ambulatory Visit: Payer: BLUE CROSS/BLUE SHIELD

## 2023-11-13 ENCOUNTER — Encounter: Payer: Self-pay | Admitting: Student

## 2023-11-13 ENCOUNTER — Ambulatory Visit (INDEPENDENT_AMBULATORY_CARE_PROVIDER_SITE_OTHER): Payer: Self-pay | Admitting: Student

## 2023-11-13 VITALS — BP 114/80 | HR 97 | Ht 62.0 in | Wt 149.5 lb

## 2023-11-13 DIAGNOSIS — Z0000 Encounter for general adult medical examination without abnormal findings: Secondary | ICD-10-CM

## 2023-11-13 DIAGNOSIS — Z23 Encounter for immunization: Secondary | ICD-10-CM | POA: Diagnosis not present

## 2023-11-13 DIAGNOSIS — R042 Hemoptysis: Secondary | ICD-10-CM | POA: Diagnosis not present

## 2023-11-13 LAB — POCT GLYCOSYLATED HEMOGLOBIN (HGB A1C): Hemoglobin A1C: 5.6 % (ref 4.0–5.6)

## 2023-11-13 MED ORDER — CETIRIZINE HCL 10 MG PO TABS: 10.0000 mg | ORAL_TABLET | Freq: Every day | ORAL | 0 refills | Status: DC

## 2023-11-13 MED ORDER — FLUTICASONE PROPIONATE 50 MCG/ACT NA SUSP: 2.0000 | Freq: Every day | NASAL | 6 refills | Status: AC

## 2023-11-13 NOTE — Progress Notes (Signed)
    SUBJECTIVE:   Chief compliant/HPI: annual examination  Sarah Peck is a 63 y.o. who presents today for an annual exam.   History tabs reviewed and updated.   Cough: The patient, with no known medical conditions or medications, presents for a physical exam.  -She reports a longstanding history of phlegm, which has been previously attributed to allergies.  -Recently, she has noticed a small amount of blood in her sputum, particularly when brushing her tongue or gargling.  - This has been occurring for about a week. She denies any associated chest pain, shortness of breath, weight loss, fevers, chills, or recent illness.  - She also denies any history of smoking or significant alcohol use.  - She reports feeling as though something is stuck in her throat, particularly at night, leading to frequent throat clearing. She denies any difficulty swallowing or acid reflux.   - She is sexually active but denies any risk of sexually transmitted diseases. There is no family history of breast cancer.  Review of systems form reviewed and notable for cough with scant hemoptysis.   OBJECTIVE:   BP 114/80   Pulse 97   Ht 5\' 2"  (1.575 m)   Wt 149 lb 8 oz (67.8 kg)   LMP 08/26/2012   SpO2 95%   BMI 27.34 kg/m   General: Alert and oriented in no apparent distress Head: [place]/AT.   Eyes:  EOMI  Ears:  External ears WNL Nose:  Septum midline  Mouth:  MMM, tonsils non-erythematous, non-edematous.   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 Differential includes chronic bronchitis, post-nasal drip, or allergies as well as more malicious diagnoses although not likely. - Order chest imaging-CXR - Order lab work including  diabetes and cholesterol screening, TSH, CBC, CMP, quant gold - Prescribe Zyrtec daily. - Prescribe Flonase nasal spray daily. Annual physical exam  Due for mammogram and influenza vaccination. Insurance coordination required for mammogram location. See AVS for age appropriate recommendations  PHQ score     11/13/2020    9:56 AM 10/30/2020    3:08 PM 02/08/2019   10:36 AM  PHQ9 SCORE ONLY  PHQ-9 Total Score 3 0 1  reviewed and discussed.  Considered the following items based upon USPSTF recommendations: Diabetes screening: ordered Screening for elevated cholesterol: ordered HIV testing:  negative 2 years ago  Hepatitis C: negative 8 years ago  Hepatitis B: N/A Syphilis if at high risk: NR 2 years ago  GC/CT: Negative 2 years ago Osteoporosis screening considered based upon risk of fracture from [facility] calculator. Major osteoporotic fracture risk is 7.8%. DEXA not ordered.  Reviewed risk factors for latent tuberculosis and not indicated Discussed family history, BRCA testing not indicated.  Cervical cancer screening: Required in 10/2024 Breast cancer screening: discussed potential benefits, risks including overdiagnosis and biopsy, elected proceed with mammogram. Colorectal cancer screening: up-to-date, due in 2027  Vaccinations Flu vaccine.   Follow up in 2 months     Alfredo Martinez, MD [facility]

## 2023-11-13 NOTE — Patient Instructions (Addendum)
 It was great to see you today! Thank you for choosing [facility] for your primary care.  Today we addressed: - Please go to [facility] [address] for chest xray  - Zyrtec daily  - Flonase daily  - We will call with Mammogram  - We are checking labs today   If you haven't already, sign up for My Chart to have easy access to your labs results, and communication with your primary care physician.  Return in about 2 months (around 01/13/2024) for Cough . Please arrive 15 minutes before your appointment to ensure smooth check in process.  We appreciate your efforts in making this happen.  Thank you for allowing me to participate in your care, Alfredo Martinez, MD 11/13/2023, 11:36 AM PGY-3, [facility]  Fue genial verte hoy! Karl Pock por elegir [facility] para su atencin primaria.  Hoy abordamos: - Laurena Bering a [facility] [address] para una radiografa de trax.  - Zyrtec diario  - Flonasa diaria  - Llamaremos con mamografa.  - Estamos revisando los laboratorios hoy.   Si an no lo ha hecho, regstrese en My Chart para tener fcil acceso a los resultados de sus laboratorios y Engineer, materials con su mdico de Marine scientist.  Regrese en aproximadamente 2 meses (alrededor del 20/12/2023) para Tos. Llegue 15 minutos antes de su cita para garantizar un proceso de registro sin problemas.  Apreciamos sus esfuerzos para que esto suceda.  Gracias por permitirme participar en su cuidado, Alfredo Martinez, MD 20/10/2023, 11:35 PGY-3, [facility]

## 2023-11-13 NOTE — Addendum Note (Signed)
 Addended by: Jennette Bill on: 11/13/2023 12:05 PM   Modules accepted: Orders

## 2023-11-20 ENCOUNTER — Encounter: Payer: Self-pay | Admitting: Student

## 2023-11-20 LAB — COMPREHENSIVE METABOLIC PANEL WITH GFR
ALT: 33 IU/L — ABNORMAL HIGH (ref 0–32)
AST: 31 IU/L (ref 0–40)
Albumin: 4.3 g/dL (ref 3.9–4.9)
Alkaline Phosphatase: 124 IU/L — ABNORMAL HIGH (ref 44–121)
BUN/Creatinine Ratio: 13 (ref 12–28)
BUN: 9 mg/dL (ref 8–27)
Bilirubin Total: 0.6 mg/dL (ref 0.0–1.2)
CO2: 24 mmol/L (ref 20–29)
Calcium: 8.9 mg/dL (ref 8.7–10.3)
Chloride: 104 mmol/L (ref 96–106)
Creatinine, Ser: 0.7 mg/dL (ref 0.57–1.00)
Globulin, Total: 2.9 g/dL (ref 1.5–4.5)
Glucose: 89 mg/dL (ref 70–99)
Potassium: 4.5 mmol/L (ref 3.5–5.2)
Sodium: 142 mmol/L (ref 134–144)
Total Protein: 7.2 g/dL (ref 6.0–8.5)
eGFR: 98 mL/min/{1.73_m2} (ref >59)

## 2023-11-20 LAB — CBC WITH DIFFERENTIAL/PLATELET
Basophils Absolute: 0 10*3/uL (ref 0.0–0.2)
Basos: 1 %
EOS (ABSOLUTE): 0.1 10*3/uL (ref 0.0–0.4)
Eos: 1 %
Hematocrit: 43.6 % (ref 34.0–46.6)
Hemoglobin: 14.6 g/dL (ref 11.1–15.9)
Immature Grans (Abs): 0 10*3/uL (ref 0.0–0.1)
Immature Granulocytes: 0 %
Lymphocytes Absolute: 1.7 10*3/uL (ref 0.7–3.1)
Lymphs: 38 %
MCH: 29.1 pg (ref 26.6–33.0)
MCHC: 33.5 g/dL (ref 31.5–35.7)
MCV: 87 fL (ref 79–97)
Monocytes Absolute: 0.4 10*3/uL (ref 0.1–0.9)
Monocytes: 8 %
Neutrophils Absolute: 2.3 10*3/uL (ref 1.4–7.0)
Neutrophils: 52 %
Platelets: 275 10*3/uL (ref 150–450)
RBC: 5.01 x10E6/uL (ref 3.77–5.28)
RDW: 13.3 % (ref 11.7–15.4)
WBC: 4.5 10*3/uL (ref 3.4–10.8)

## 2023-11-20 LAB — QUANTIFERON-TB GOLD PLUS
QuantiFERON Nil Value: 0.34 [IU]/mL
QuantiFERON TB1 Ag Value: 0.43 [IU]/mL
QuantiFERON TB2 Ag Value: 0.45 [IU]/mL

## 2023-11-20 LAB — LIPID PANEL
Chol/HDL Ratio: 4.4 ratio (ref 0.0–4.4)
Cholesterol, Total: 214 mg/dL — ABNORMAL HIGH (ref 100–199)
HDL: 49 mg/dL (ref >39)
LDL Chol Calc (NIH): 137 mg/dL — ABNORMAL HIGH (ref 0–99)
Triglycerides: 156 mg/dL — ABNORMAL HIGH (ref 0–149)
VLDL Cholesterol Cal: 28 mg/dL (ref 5–40)

## 2023-11-20 LAB — TSH RFX ON ABNORMAL TO FREE T4: TSH: 2.42 u[IU]/mL (ref 0.450–4.500)

## 2023-12-17 ENCOUNTER — Other Ambulatory Visit: Payer: Self-pay | Admitting: Student

## 2024-01-15 ENCOUNTER — Ambulatory Visit: Payer: Self-pay | Admitting: Student

## 2024-02-03 ENCOUNTER — Encounter: Payer: Self-pay | Admitting: *Deleted
